# Patient Record
Sex: Female | Born: 2000 | Race: White | Hispanic: No | Marital: Single | Smoking: Never smoker
Health system: Southern US, Community
[De-identification: ages and names within clinical notes are randomized; demographics above are authoritative.]

## PROBLEM LIST (undated history)

## (undated) ENCOUNTER — Emergency Department (HOSPITAL_BASED_OUTPATIENT_CLINIC_OR_DEPARTMENT_OTHER): Admission: EM | Payer: Medicaid Other | Source: Home / Self Care

## (undated) DIAGNOSIS — S060XAA Concussion with loss of consciousness status unknown, initial encounter: Secondary | ICD-10-CM

## (undated) DIAGNOSIS — S0990XA Unspecified injury of head, initial encounter: Secondary | ICD-10-CM

## (undated) DIAGNOSIS — F329 Major depressive disorder, single episode, unspecified: Secondary | ICD-10-CM

## (undated) DIAGNOSIS — F32A Depression, unspecified: Secondary | ICD-10-CM

## (undated) DIAGNOSIS — F411 Generalized anxiety disorder: Secondary | ICD-10-CM

## (undated) DIAGNOSIS — G43909 Migraine, unspecified, not intractable, without status migrainosus: Secondary | ICD-10-CM

## (undated) DIAGNOSIS — F4310 Post-traumatic stress disorder, unspecified: Secondary | ICD-10-CM

## (undated) DIAGNOSIS — F419 Anxiety disorder, unspecified: Secondary | ICD-10-CM

## (undated) DIAGNOSIS — S060X9A Concussion with loss of consciousness of unspecified duration, initial encounter: Secondary | ICD-10-CM

## (undated) HISTORY — DX: Post-traumatic stress disorder, unspecified: F43.10

## (undated) HISTORY — DX: Major depressive disorder, single episode, unspecified: F32.9

## (undated) HISTORY — DX: Migraine, unspecified, not intractable, without status migrainosus: G43.909

## (undated) HISTORY — DX: Anxiety disorder, unspecified: F41.9

## (undated) HISTORY — PX: URETHRAL DILATION: SUR417

## (undated) HISTORY — DX: Concussion with loss of consciousness status unknown, initial encounter: S06.0XAA

## (undated) HISTORY — DX: Generalized anxiety disorder: F41.1

## (undated) HISTORY — DX: Concussion with loss of consciousness of unspecified duration, initial encounter: S06.0X9A

## (undated) HISTORY — DX: Unspecified injury of head, initial encounter: S09.90XA

## (undated) HISTORY — PX: TONSILLECTOMY AND ADENOIDECTOMY: SUR1326

## (undated) HISTORY — PX: TYMPANOSTOMY TUBE PLACEMENT: SHX32

## (undated) HISTORY — DX: Depression, unspecified: F32.A

---
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
## 2020-11-14 ENCOUNTER — Other Ambulatory Visit: Payer: Self-pay

## 2020-11-14 ENCOUNTER — Ambulatory Visit (INDEPENDENT_AMBULATORY_CARE_PROVIDER_SITE_OTHER): Payer: BC Managed Care – PPO | Admitting: Otolaryngology

## 2020-11-14 VITALS — Temp 97.5°F

## 2020-11-14 DIAGNOSIS — Z9622 Myringotomy tube(s) status: Secondary | ICD-10-CM | POA: Diagnosis not present

## 2020-11-14 NOTE — Progress Notes (Signed)
HPI: April Harding is a 20 y.o. female who presents for evaluation of retained left myringotomy tube.  Patient had a previous T&A performed here in 2004 and had tubes placed when she was a child.  However she last had a set of tubes placed in 2016 and has a retained tube in the left ear that gives her intermittent problems and drainage.  She has had no drainage or infections in the right ear recently.  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  Past Medical History:  Diagnosis Date  . Anxiety   . Depression    Past Surgical History:  Procedure Laterality Date  . TONSILLECTOMY AND ADENOIDECTOMY    . TYMPANOSTOMY TUBE PLACEMENT    . URETHRAL DILATION     Social History   Socioeconomic History  . Marital status: Single    Spouse name: Not on file  . Number of children: Not on file  . Years of education: Not on file  . Highest education level: Not on file  Occupational History  . Not on file  Tobacco Use  . Smoking status: Never Smoker  . Smokeless tobacco: Never Used  Substance and Sexual Activity  . Alcohol use: No  . Drug use: No  . Sexual activity: Never  Other Topics Concern  . Not on file  Social History Narrative  . Not on file   Social Determinants of Health   Financial Resource Strain: Not on file  Food Insecurity: Not on file  Transportation Needs: Not on file  Physical Activity: Not on file  Stress: Not on file  Social Connections: Not on file   Family History  Problem Relation Age of Onset  . Sudden death Neg Hx   . Hyperlipidemia Neg Hx   . Hypertension Neg Hx   . Heart attack Neg Hx   . Diabetes Neg Hx    Allergies  Allergen Reactions  . Caine-1 [Lidocaine] Swelling    Novacaine   Prior to Admission medications   Medication Sig Start Date End Date Taking? Authorizing Provider  ALBUTEROL IN Inhale 2 puffs into the lungs as needed.    [provider]  cefdinir (OMNICEF) 300 MG capsule Take 1 capsule (300 mg total) by mouth 2  (two) times daily. 08/26/14   Janne Napoleon, NP  cetirizine (ZYRTEC) 10 MG tablet Take 10 mg by mouth daily.    [provider]  EPINEPHrine (EPIPEN JR) 0.15 MG/0.3ML injection Inject 0.15 mg into the muscle as needed.    [provider]  FLUoxetine (PROZAC) 10 MG tablet Take 30 mg by mouth daily.    [provider]  fluticasone (FLONASE) 50 MCG/ACT nasal spray Place 2 sprays into the nose daily.    [provider]  HYDROcodone-acetaminophen (NORCO/VICODIN) 5-325 MG per tablet Take 0.5 tablets by mouth every 4 (four) hours as needed. 08/26/14   Janne Napoleon, NP  loperamide (IMODIUM) 2 MG capsule Take 1 capsule (2 mg total) by mouth 4 (four) times daily as needed for diarrhea or loose stools. 11/29/13   Molpus, John, MD  ondansetron (ZOFRAN) 4 MG tablet Take 1 tablet (4 mg total) by mouth every 6 (six) hours. 11/27/13   Irish Elders, FNP  polyethylene glycol (MIRALAX / GLYCOLAX) packet Take 17 g by mouth daily.    [provider]     Positive ROS: Otherwise negative  All other systems have been reviewed and were otherwise negative with the exception of those mentioned in  the HPI and as above.  Physical Exam: Constitutional: Alert, well-appearing, no acute distress Ears: External ears without lesions or tenderness. Ear canals are clear bilaterally.  Right TM is clear.  Left TM reveals a retained left myringotomy tube consistent with a Sheehy collar-button tube. Nasal: External nose without lesions. Septum midline. Clear nasal passages Oral: Lips and gums without lesions. Tongue and palate mucosa without lesions. Posterior oropharynx clear. Neck: No palpable adenopathy or masses Respiratory: Breathing comfortably  Skin: No facial/neck lesions or rash noted.  Procedures  Assessment: Retained left sheet collar-button myringotomy tube for 6 years.  Plan: I discussed removing this in the office today.  She would like to have it removed and we'll plan  on scheduling removal in a couple of days with topical anesthesia.  The tubes were previously placed under general anesthesia.  Narda Bonds, MD

## 2020-11-18 ENCOUNTER — Other Ambulatory Visit: Payer: Self-pay

## 2020-11-18 ENCOUNTER — Ambulatory Visit (INDEPENDENT_AMBULATORY_CARE_PROVIDER_SITE_OTHER): Payer: BC Managed Care – PPO | Admitting: Otolaryngology

## 2020-11-18 VITALS — Temp 96.6°F

## 2020-11-18 DIAGNOSIS — T162XXA Foreign body in left ear, initial encounter: Secondary | ICD-10-CM

## 2020-11-18 NOTE — Progress Notes (Signed)
HPI: April Harding is a 20 y.o. female who returns today for evaluation of retained left myringotomy tube that she has had for a number of years.  She has a Sheehy collar-button tube embedded within the left TM with no active drainage.  She presents today to have this removed.  Right TM is clear..  Past Medical History:  Diagnosis Date  . Anxiety   . Depression    Past Surgical History:  Procedure Laterality Date  . TONSILLECTOMY AND ADENOIDECTOMY    . TYMPANOSTOMY TUBE PLACEMENT    . URETHRAL DILATION     Social History   Socioeconomic History  . Marital status: Single    Spouse name: Not on file  . Number of children: Not on file  . Years of education: Not on file  . Highest education level: Not on file  Occupational History  . Not on file  Tobacco Use  . Smoking status: Never Smoker  . Smokeless tobacco: Never Used  Substance and Sexual Activity  . Alcohol use: No  . Drug use: No  . Sexual activity: Never  Other Topics Concern  . Not on file  Social History Narrative  . Not on file   Social Determinants of Health   Financial Resource Strain: Not on file  Food Insecurity: Not on file  Transportation Needs: Not on file  Physical Activity: Not on file  Stress: Not on file  Social Connections: Not on file   Family History  Problem Relation Age of Onset  . Sudden death Neg Hx   . Hyperlipidemia Neg Hx   . Hypertension Neg Hx   . Heart attack Neg Hx   . Diabetes Neg Hx    Allergies  Allergen Reactions  . Caine-1 [Lidocaine] Swelling    Novacaine   Prior to Admission medications   Medication Sig Start Date End Date Taking? Authorizing Provider  ALBUTEROL IN Inhale 2 puffs into the lungs as needed.    [provider]  cefdinir (OMNICEF) 300 MG capsule Take 1 capsule (300 mg total) by mouth 2 (two) times daily. 08/26/14   Janne Napoleon, NP  cetirizine (ZYRTEC) 10 MG tablet Take 10 mg by mouth daily.    [provider]  EPINEPHrine (EPIPEN JR)  0.15 MG/0.3ML injection Inject 0.15 mg into the muscle as needed.    [provider]  FLUoxetine (PROZAC) 10 MG tablet Take 30 mg by mouth daily.    [provider]  fluticasone (FLONASE) 50 MCG/ACT nasal spray Place 2 sprays into the nose daily.    [provider]  HYDROcodone-acetaminophen (NORCO/VICODIN) 5-325 MG per tablet Take 0.5 tablets by mouth every 4 (four) hours as needed. 08/26/14   Janne Napoleon, NP  loperamide (IMODIUM) 2 MG capsule Take 1 capsule (2 mg total) by mouth 4 (four) times daily as needed for diarrhea or loose stools. 11/29/13   Molpus, John, MD  ondansetron (ZOFRAN) 4 MG tablet Take 1 tablet (4 mg total) by mouth every 6 (six) hours. 11/27/13   Irish Elders, FNP  polyethylene glycol (MIRALAX / GLYCOLAX) packet Take 17 g by mouth daily.    [provider]     Positive ROS: Otherwise negative  All other systems have been reviewed and were otherwise negative with the exception of those mentioned in the HPI and as above.  Physical Exam: Constitutional: Alert, well-appearing, no acute distress Ears: External ears without lesions or tenderness.  Right TM is clear.  Left TM reveals a retained  Sheehy collar-button tube.  Xylocaine was placed in the ear for local topical anesthetic.  After 5 minutes the tube was removed from the TM with small perforation and granulation tissue.  I would expect this to heal spontaneously. Nasal: External nose without lesions. Clear nasal passages Oral: Lips and gums without lesions. Tongue and palate mucosa without lesions. Posterior oropharynx clear. Neck: No palpable adenopathy or masses Respiratory: Breathing comfortably  Skin: No facial/neck lesions or rash noted.  Removal of ear foreign body  Date/Time: 11/18/2020 10:39 AM Performed by: Drema Halon, MD Authorized by: Drema Halon, MD   Consent:    Consent obtained:  Verbal   Consent given by:  Patient Location:    Location:   Ear Pre-procedure details:    Imaging:  None Anesthesia:    Topical anesthetic:  Lidocaine drops Procedure details:    Localization method:  Microscope   Removal mechanism:  Alligator forceps   Procedure complexity:  Simple   Foreign bodies recovered:  3 Comments:     Patient with retained left sheehy collar-button tube that was removed using a microscope and alligator forceps.  This was removed easily with minimal TM perforation and granulation tissue that should heal spontaneously.    Assessment: Retained left myringotomy tube.  Plan: This was removed in the office discussed with patient concerning keeping water out of the ear for the next 2 weeks and will notify us if she has any further problems with the ear.   Narda Bonds, MD

## 2020-12-18 ENCOUNTER — Encounter (HOSPITAL_BASED_OUTPATIENT_CLINIC_OR_DEPARTMENT_OTHER): Payer: Self-pay | Admitting: Emergency Medicine

## 2020-12-18 ENCOUNTER — Emergency Department (HOSPITAL_BASED_OUTPATIENT_CLINIC_OR_DEPARTMENT_OTHER)
Admission: EM | Admit: 2020-12-18 | Discharge: 2020-12-18 | Disposition: A | Discharge status: Home / Self Care | Payer: BC Managed Care – PPO | Attending: Emergency Medicine | Admitting: Emergency Medicine

## 2020-12-18 ENCOUNTER — Emergency Department (HOSPITAL_BASED_OUTPATIENT_CLINIC_OR_DEPARTMENT_OTHER): Payer: BC Managed Care – PPO

## 2020-12-18 ENCOUNTER — Other Ambulatory Visit: Payer: Self-pay

## 2020-12-18 DIAGNOSIS — S0990XA Unspecified injury of head, initial encounter: Secondary | ICD-10-CM | POA: Diagnosis present

## 2020-12-18 DIAGNOSIS — S060X1A Concussion with loss of consciousness of 30 minutes or less, initial encounter: Secondary | ICD-10-CM | POA: Insufficient documentation

## 2020-12-18 DIAGNOSIS — W2107XA Struck by softball, initial encounter: Secondary | ICD-10-CM | POA: Diagnosis not present

## 2020-12-18 MED ORDER — ONDANSETRON HCL 4 MG PO TABS: 4.0000 mg | ORAL_TABLET | Freq: Four times a day (QID) | ORAL | 0 refills | Status: DC

## 2020-12-18 NOTE — ED Triage Notes (Signed)
Reports being hit in the head with a softball going using pitching machine.  Hit on the front of the head with helmet on.  Reports she blacked out for a second.  This happened around 6pm.  Having headache since.

## 2020-12-18 NOTE — ED Provider Notes (Signed)
[facility] EMERGENCY DEPARTMENT Provider Note   CSN: [id number] Arrival date & time: 12/18/20  2035     History Chief Complaint  Patient presents with  . Head Injury    April Harding is a 20 y.o. female.  Pt presents to the ED today with a headache.  Pt was using a softball pitching machine and was hit in the front of her head with a softball going about 50 mph.  The pt was wearing a catcher's helmet.  She thinks she blacked out for a second.  She's had a headache since and has had some nausea, although no nausea now.  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        Past Medical History:  Diagnosis Date  . Anxiety   . Depression     Patient Active Problem List   Diagnosis Date Noted  . Concussion with loss of consciousness 02/20/2013  . Anxiety, generalized 03/26/2012  . Family disruption due to divorce 03/26/2012    Past Surgical History:  Procedure Laterality Date  . TONSILLECTOMY AND ADENOIDECTOMY    . TYMPANOSTOMY TUBE PLACEMENT    . URETHRAL DILATION       OB History   No obstetric history on file.     Family History  Problem Relation Age of Onset  . Sudden death Neg Hx   . Hyperlipidemia Neg Hx   . Hypertension Neg Hx   . Heart attack Neg Hx   . Diabetes Neg Hx     Social History   Tobacco Use  . Smoking status: Never Smoker  . Smokeless tobacco: Never Used  Substance Use Topics  . Alcohol use: No  . Drug use: No    Home Medications Prior to Admission medications   Medication Sig Start Date End Date Taking? Authorizing Provider  DULoxetine (CYMBALTA) 20 MG capsule Take 20 mg by mouth daily.   Yes [provider]  ondansetron (ZOFRAN) 4 MG tablet Take 1 tablet (4 mg total) by mouth every 6 (six) hours. 12/18/20  Yes Jacalyn Lefevre, MD  ALBUTEROL IN Inhale 2 puffs into the lungs as needed.    [provider]  cefdinir (OMNICEF) 300 MG capsule Take 1 capsule (300 mg total) by mouth 2 (two) times daily. 08/26/14    Janne Napoleon, NP  cetirizine (ZYRTEC) 10 MG tablet Take 10 mg by mouth daily.    [provider]  EPINEPHrine (EPIPEN JR) 0.15 MG/0.3ML injection Inject 0.15 mg into the muscle as needed.    [provider]  FLUoxetine (PROZAC) 10 MG tablet Take 30 mg by mouth daily.    [provider]  fluticasone (FLONASE) 50 MCG/ACT nasal spray Place 2 sprays into the nose daily.    [provider]  HYDROcodone-acetaminophen (NORCO/VICODIN) 5-325 MG per tablet Take 0.5 tablets by mouth every 4 (four) hours as needed. 08/26/14   Janne Napoleon, NP  loperamide (IMODIUM) 2 MG capsule Take 1 capsule (2 mg total) by mouth 4 (four) times daily as needed for diarrhea or loose stools. 11/29/13   Molpus, John, MD  polyethylene glycol (MIRALAX / GLYCOLAX) packet Take 17 g by mouth daily.    [provider]    Allergies    Caine-1 [lidocaine]  Review of Systems   Review of Systems  Gastrointestinal: Positive for nausea.  Neurological: Positive for headaches.    Physical Exam Updated Vital Signs BP 130/77 (BP Location: Right Arm)   Pulse 90   Temp 98.6  F (37 C) (Oral)   Resp 18   Ht 5\' 8"  (1.727 m)   Wt 71.7 kg   SpO2 100%   BMI 24.02 kg/m   Physical Exam Vitals and nursing note reviewed.  Constitutional:      Appearance: Normal appearance.  HENT:     Head: Normocephalic and atraumatic.     Right Ear: External ear normal.     Left Ear: External ear normal.     Nose: Nose normal.     Mouth/Throat:     Mouth: Mucous membranes are moist.     Pharynx: Oropharynx is clear.  Eyes:     Extraocular Movements: Extraocular movements intact.     Conjunctiva/sclera: Conjunctivae normal.     Pupils: Pupils are equal, round, and reactive to light.  Cardiovascular:     Rate and Rhythm: Normal rate and regular rhythm.     Pulses: Normal pulses.     Heart sounds: Normal heart sounds.  Pulmonary:     Effort: Pulmonary effort is normal.     Breath sounds: Normal  breath sounds.  Abdominal:     General: Abdomen is flat. Bowel sounds are normal.     Palpations: Abdomen is soft.  Musculoskeletal:        General: Normal range of motion.     Cervical back: Normal range of motion and neck supple.  Skin:    General: Skin is warm.     Capillary Refill: Capillary refill takes less than 2 seconds.  Neurological:     General: No focal deficit present.     Mental Status: She is alert and oriented to person, place, and time.  Psychiatric:        Mood and Affect: Mood normal.        Behavior: Behavior normal.        Thought Content: Thought content normal.        Judgment: Judgment normal.     ED Results / Procedures / Treatments   Labs (all labs ordered are listed, but only abnormal results are displayed) Labs Reviewed - No data to display  EKG None  Radiology CT Head Wo Contrast  Result Date: 12/18/2020 CLINICAL DATA:  Status post hit in head by softball. EXAM: CT HEAD WITHOUT CONTRAST TECHNIQUE: Contiguous axial images were obtained from the base of the skull through the vertex without intravenous contrast. COMPARISON:  CT head 04/09/2013 FINDINGS: Brain: No evidence of large-territorial acute infarction. No parenchymal hemorrhage. No mass lesion. No extra-axial collection. No mass effect or midline shift. No hydrocephalus. Basilar cisterns are patent. Vascular: No hyperdense vessel. Skull: No acute fracture or focal lesion. Sinuses/Orbits: Paranasal sinuses and mastoid air cells are clear. The orbits are unremarkable. Other: None. IMPRESSION: No acute intracranial abnormality. Electronically Signed   By: 04/11/2013 M.D.   On: 12/18/2020 21:17    Procedures Procedures   Medications Ordered in ED Medications - No data to display  ED Course  I have reviewed the triage vital signs and the nursing notes.  Pertinent labs & imaging results that were available during my care of the patient were reviewed by me and considered in my medical  decision making (see chart for details).    MDM Rules/Calculators/A&P                          Ct ok, but pt likely has a mild concussion.  Pt told to talk to her athletic trainer about going  back to sport after concussion.   Final Clinical Impression(s) / ED Diagnoses Final diagnoses:  Concussion with loss of consciousness of 30 minutes or less, initial encounter  Minor head injury, initial encounter    Rx / DC Orders ED Discharge Orders         Ordered    ondansetron (ZOFRAN) 4 MG tablet  Every 6 hours        12/18/20 2130           Jacalyn Lefevre, MD 12/18/20 2130

## 2021-01-15 ENCOUNTER — Emergency Department (HOSPITAL_BASED_OUTPATIENT_CLINIC_OR_DEPARTMENT_OTHER)
Admission: EM | Admit: 2021-01-15 | Discharge: 2021-01-15 | Disposition: A | Discharge status: Home / Self Care | Payer: BC Managed Care – PPO | Attending: Emergency Medicine | Admitting: Emergency Medicine

## 2021-01-15 ENCOUNTER — Other Ambulatory Visit: Payer: Self-pay

## 2021-01-15 ENCOUNTER — Encounter (HOSPITAL_BASED_OUTPATIENT_CLINIC_OR_DEPARTMENT_OTHER): Payer: Self-pay | Admitting: Emergency Medicine

## 2021-01-15 DIAGNOSIS — R630 Anorexia: Secondary | ICD-10-CM | POA: Insufficient documentation

## 2021-01-15 DIAGNOSIS — R5383 Other fatigue: Secondary | ICD-10-CM | POA: Insufficient documentation

## 2021-01-15 DIAGNOSIS — R002 Palpitations: Secondary | ICD-10-CM | POA: Diagnosis not present

## 2021-01-15 DIAGNOSIS — Z79899 Other long term (current) drug therapy: Secondary | ICD-10-CM | POA: Insufficient documentation

## 2021-01-15 DIAGNOSIS — X58XXXA Exposure to other specified factors, initial encounter: Secondary | ICD-10-CM | POA: Diagnosis not present

## 2021-01-15 DIAGNOSIS — T50905A Adverse effect of unspecified drugs, medicaments and biological substances, initial encounter: Secondary | ICD-10-CM

## 2021-01-15 DIAGNOSIS — R519 Headache, unspecified: Secondary | ICD-10-CM | POA: Diagnosis present

## 2021-01-15 DIAGNOSIS — G43809 Other migraine, not intractable, without status migrainosus: Secondary | ICD-10-CM | POA: Diagnosis not present

## 2021-01-15 DIAGNOSIS — T450X5A Adverse effect of antiallergic and antiemetic drugs, initial encounter: Secondary | ICD-10-CM | POA: Insufficient documentation

## 2021-01-15 LAB — CBC WITH DIFFERENTIAL/PLATELET
Abs Immature Granulocytes: 0.01 10*3/uL (ref 0.00–0.07)
Basophils Absolute: 0 10*3/uL (ref 0.0–0.1)
Basophils Relative: 1 %
Eosinophils Absolute: 0.2 10*3/uL (ref 0.0–0.5)
Eosinophils Relative: 2 %
HCT: 35.8 % — ABNORMAL LOW (ref 36.0–46.0)
Hemoglobin: 12.3 g/dL (ref 12.0–15.0)
Immature Granulocytes: 0 %
Lymphocytes Relative: 43 %
Lymphs Abs: 2.9 10*3/uL (ref 0.7–4.0)
MCH: 28.9 pg (ref 26.0–34.0)
MCHC: 34.4 g/dL (ref 30.0–36.0)
MCV: 84.2 fL (ref 80.0–100.0)
Monocytes Absolute: 0.6 10*3/uL (ref 0.1–1.0)
Monocytes Relative: 9 %
Neutro Abs: 3.1 10*3/uL (ref 1.7–7.7)
Neutrophils Relative %: 45 %
Platelets: 239 10*3/uL (ref 150–400)
RBC: 4.25 MIL/uL (ref 3.87–5.11)
RDW: 12.7 % (ref 11.5–15.5)
WBC: 6.8 10*3/uL (ref 4.0–10.5)
nRBC: 0 % (ref 0.0–0.2)

## 2021-01-15 LAB — BASIC METABOLIC PANEL
Anion gap: 8 (ref 5–15)
BUN: 7 mg/dL (ref 6–20)
CO2: 24 mmol/L (ref 22–32)
Calcium: 8.7 mg/dL — ABNORMAL LOW (ref 8.9–10.3)
Chloride: 105 mmol/L (ref 98–111)
Creatinine, Ser: 0.67 mg/dL (ref 0.44–1.00)
GFR, Estimated: >60 mL/min (ref >60)
Glucose, Bld: 96 mg/dL (ref 70–99)
Potassium: 3.6 mmol/L (ref 3.5–5.1)
Sodium: 137 mmol/L (ref 135–145)

## 2021-01-15 MED ORDER — SODIUM CHLORIDE 0.9 % IV BOLUS
1000.0000 mL | Freq: Once | INTRAVENOUS | Status: AC
  Administered 2021-01-15: 1000 mL via INTRAVENOUS

## 2021-01-15 MED ORDER — METOCLOPRAMIDE HCL 5 MG/ML IJ SOLN
10.0000 mg | Freq: Once | INTRAMUSCULAR | Status: AC
  Administered 2021-01-15: 10 mg via INTRAVENOUS
  Filled 2021-01-15: qty 2

## 2021-01-15 MED ORDER — LORAZEPAM 2 MG/ML IJ SOLN
1.0000 mg | Freq: Once | INTRAMUSCULAR | Status: AC
  Administered 2021-01-15: 1 mg via INTRAVENOUS
  Filled 2021-01-15: qty 1

## 2021-01-15 MED ORDER — KETOROLAC TROMETHAMINE 15 MG/ML IJ SOLN
15.0000 mg | Freq: Once | INTRAMUSCULAR | Status: AC
  Administered 2021-01-15: 15 mg via INTRAVENOUS
  Filled 2021-01-15: qty 1

## 2021-01-15 MED ORDER — DIPHENHYDRAMINE HCL 50 MG/ML IJ SOLN
25.0000 mg | Freq: Once | INTRAMUSCULAR | Status: AC
  Administered 2021-01-15: 25 mg via INTRAVENOUS
  Filled 2021-01-15: qty 1

## 2021-01-15 NOTE — ED Triage Notes (Addendum)
Reports HA with photosensitivity, intermittent tachycardia, intermittent lightheadedness, low energy, posterior neck pain, and lower back pain X 5 days. Denies fever, n/v/d but does endorse abd pain after eating.

## 2021-01-15 NOTE — ED Notes (Signed)
Patient is resting comfortably. 

## 2021-01-15 NOTE — ED Provider Notes (Signed)
[facility] Provider Note: April Dell, MD, FACEP  CSN: [id number] MRN: [id number] ARRIVAL: 01/15/21 at 0342 ROOM: MH09/MH09   CHIEF COMPLAINT  Headache   HISTORY OF PRESENT ILLNESS  01/15/21 4:27 AM April Harding is a 20 y.o. female who has had a headache for the past 5 days.  The headache is located primarily a separately and bilateral parietally.  She describes it as aching and rates it as a 7 out of 10.  She has taken Excedrin without relief.  She has not had nausea or vomiting with it but does have photophobia.  She has had decreased appetite and general fatigue for the past 3 days.  She is also had intermittent palpitations (skipped beats, rapid heartbeat).  She has not had a fever or dysuria.  Her last menstrual period was 01/03/2021.   Past Medical History:  Diagnosis Date  . Anxiety   . Depression     Past Surgical History:  Procedure Laterality Date  . TONSILLECTOMY AND ADENOIDECTOMY    . TYMPANOSTOMY TUBE PLACEMENT    . URETHRAL DILATION      Family History  Problem Relation Age of Onset  . Sudden death Neg Hx   . Hyperlipidemia Neg Hx   . Hypertension Neg Hx   . Heart attack Neg Hx   . Diabetes Neg Hx     Social History   Tobacco Use  . Smoking status: Never Smoker  . Smokeless tobacco: Never Used  Substance Use Topics  . Alcohol use: No  . Drug use: No    Prior to Admission medications   Medication Sig Start Date End Date Taking? Authorizing Provider  ALBUTEROL IN Inhale 2 puffs into the lungs as needed.    [provider]  cetirizine (ZYRTEC) 10 MG tablet Take 10 mg by mouth daily.    [provider]  DULoxetine (CYMBALTA) 20 MG capsule Take 20 mg by mouth daily.    [provider]  EPINEPHrine (EPIPEN JR) 0.15 MG/0.3ML injection Inject 0.15 mg into the muscle as needed.    [provider]  FLUoxetine (PROZAC) 10 MG tablet Take 30 mg by mouth daily.    [provider]  fluticasone  (FLONASE) 50 MCG/ACT nasal spray Place 2 sprays into the nose daily.    [provider]  polyethylene glycol (MIRALAX / GLYCOLAX) packet Take 17 g by mouth daily.    [provider]    Allergies Reglan [metoclopramide] and Caine-1 [lidocaine]   REVIEW OF SYSTEMS  Negative except as noted here or in the History of Present Illness.   PHYSICAL EXAMINATION  Initial Vital Signs Blood pressure 123/85, pulse 89, temperature 98.5 F (36.9 C), temperature source Oral, resp. rate 20, height 5\' 8"  (1.727 m), weight 74.8 kg, last menstrual period 01/03/2021, SpO2 100 %.  Examination General: Well-developed, well-nourished female in no acute distress; appearance consistent with age of record HENT: normocephalic; atraumatic Eyes: pupils equal, round and reactive to light; extraocular muscles intact Neck: supple Heart: regular rate and rhythm Lungs: clear to auscultation bilaterally Abdomen: soft; nondistended; nontender; bowel sounds present Extremities: No deformity; full range of motion; pulses normal Neurologic: Awake, alert and oriented; motor function intact in all extremities and symmetric; no facial droop Skin: Warm and dry Psychiatric: Flat affect   RESULTS  Summary of this visit's results, reviewed and interpreted by myself:   EKG Interpretation  Date/Time:    Ventricular Rate:    PR Interval:    QRS Duration:  QT Interval:    QTC Calculation:   R Axis:     Text Interpretation:        Laboratory Studies: Results for orders placed or performed during the hospital encounter of 01/15/21 (from the past 24 hour(s))  CBC with Differential/Platelet     Status: Abnormal   Collection Time: 01/15/21  4:52 AM  Result Value Ref Range   WBC 6.8 4.0 - 10.5 K/uL   RBC 4.25 3.87 - 5.11 MIL/uL   Hemoglobin 12.3 12.0 - 15.0 g/dL   HCT 35.4 (L) 65.6 - 81.2 %   MCV 84.2 80.0 - 100.0 fL   MCH 28.9 26.0 - 34.0 pg   MCHC 34.4 30.0 - 36.0 g/dL   RDW 75.1 70.0 - 17.4 %    Platelets 239 150 - 400 K/uL   nRBC 0.0 0.0 - 0.2 %   Neutrophils Relative % 45 %   Neutro Abs 3.1 1.7 - 7.7 K/uL   Lymphocytes Relative 43 %   Lymphs Abs 2.9 0.7 - 4.0 K/uL   Monocytes Relative 9 %   Monocytes Absolute 0.6 0.1 - 1.0 K/uL   Eosinophils Relative 2 %   Eosinophils Absolute 0.2 0.0 - 0.5 K/uL   Basophils Relative 1 %   Basophils Absolute 0.0 0.0 - 0.1 K/uL   Immature Granulocytes 0 %   Abs Immature Granulocytes 0.01 0.00 - 0.07 K/uL  Basic metabolic panel     Status: Abnormal   Collection Time: 01/15/21  4:52 AM  Result Value Ref Range   Sodium 137 135 - 145 mmol/L   Potassium 3.6 3.5 - 5.1 mmol/L   Chloride 105 98 - 111 mmol/L   CO2 24 22 - 32 mmol/L   Glucose, Bld 96 70 - 99 mg/dL   BUN 7 6 - 20 mg/dL   Creatinine, Ser 9.44 0.44 - 1.00 mg/dL   Calcium 8.7 (L) 8.9 - 10.3 mg/dL   GFR, Estimated >96 >75 mL/min   Anion gap 8 5 - 15   Imaging Studies: No results found.  ED COURSE and MDM  Nursing notes, initial and subsequent vitals signs, including pulse oximetry, reviewed and interpreted by myself.  Vitals:   01/15/21 0430 01/15/21 0500 01/15/21 0515 01/15/21 0528  BP: 116/76     Pulse: 89 (!) 134 89 97  Resp:  (!) 28 (!) 25 19  Temp:      TempSrc:      SpO2: 100% 100% 98% 98%  Weight:      Height:       Medications  sodium chloride 0.9 % bolus 1,000 mL (1,000 mLs Intravenous New Bag/Given 01/15/21 0450)  diphenhydrAMINE (BENADRYL) injection 25 mg (25 mg Intravenous Given 01/15/21 0451)  metoCLOPramide (REGLAN) injection 10 mg (10 mg Intravenous Given 01/15/21 0451)  ketorolac (TORADOL) 15 MG/ML injection 15 mg (15 mg Intravenous Given 01/15/21 0451)  LORazepam (ATIVAN) injection 1 mg (1 mg Intravenous Given 01/15/21 0543)   6:06 AM The patient had a fairly severe episode of akathisia following Reglan administration.  Her heart rate went up to about 160 (sinus tachycardia).  She was threatening to pull her IV out and leave.  She was given 1 mg of Ativan  and calm down.  She is now resting peacefully.  She will awaken and states her headache is completely gone at this time.  I suspect new onset migraine given her age.  Her mother states she does have a history of anxiety and and the akathisia may have  triggered a panic attack.  PROCEDURES  Procedures   ED DIAGNOSES     ICD-10-CM   1. Migraine variant with headache  G43.809   2. Adverse effect of drug, initial encounter  T50.905A        Jahlil Ziller, Jonny Ruiz, MD 01/15/21 [phone]

## 2021-01-15 NOTE — ED Notes (Signed)
Pt mother asking RN to come to bedside after pt heart rate begins increasing rapidly and states she is having a panic attack. Rn and MD at bedside at this time to assess pt. Pt mother reports that Pt has hx of anxiety attacks. RN recently gave 10mg  of reglan to PT via IV push and MD states that her anxiety attack was caused by the dosage of reglan. PT allergy list updated at this time. RN at bedside until Pt became calm and then restful.

## 2021-02-07 ENCOUNTER — Encounter: Payer: Self-pay | Admitting: *Deleted

## 2021-02-08 ENCOUNTER — Ambulatory Visit: Payer: No Typology Code available for payment source | Admitting: Diagnostic Neuroimaging

## 2021-02-08 ENCOUNTER — Encounter: Payer: Self-pay | Admitting: Diagnostic Neuroimaging

## 2021-02-08 VITALS — BP 125/78 | HR 96 | Ht 68.0 in | Wt 168.0 lb

## 2021-02-08 DIAGNOSIS — F0781 Postconcussional syndrome: Secondary | ICD-10-CM | POA: Diagnosis not present

## 2021-02-08 DIAGNOSIS — R519 Headache, unspecified: Secondary | ICD-10-CM

## 2021-02-08 DIAGNOSIS — R51 Headache: Secondary | ICD-10-CM
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
## 2021-03-08 ENCOUNTER — Other Ambulatory Visit: Payer: Self-pay

## 2021-03-08 ENCOUNTER — Encounter (HOSPITAL_COMMUNITY): Payer: Self-pay | Admitting: Registered Nurse

## 2021-03-08 ENCOUNTER — Ambulatory Visit (INDEPENDENT_AMBULATORY_CARE_PROVIDER_SITE_OTHER)
Admission: EM | Admit: 2021-03-08 | Discharge: 2021-03-08 | Disposition: A | Discharge status: Other Acute Inpatient Hospital | Payer: No Typology Code available for payment source | Source: Home / Self Care

## 2021-03-08 DIAGNOSIS — Z79899 Other long term (current) drug therapy: Secondary | ICD-10-CM | POA: Insufficient documentation

## 2021-03-08 DIAGNOSIS — Z20822 Contact with and (suspected) exposure to covid-19: Secondary | ICD-10-CM | POA: Insufficient documentation

## 2021-03-08 DIAGNOSIS — F411 Generalized anxiety disorder: Secondary | ICD-10-CM | POA: Insufficient documentation

## 2021-03-08 DIAGNOSIS — F332 Major depressive disorder, recurrent severe without psychotic features: Secondary | ICD-10-CM | POA: Diagnosis present

## 2021-03-08 DIAGNOSIS — R45851 Suicidal ideations: Secondary | ICD-10-CM | POA: Insufficient documentation

## 2021-03-08 DIAGNOSIS — F410 Panic disorder [episodic paroxysmal anxiety] without agoraphobia: Secondary | ICD-10-CM | POA: Insufficient documentation

## 2021-03-08 LAB — CBC WITH DIFFERENTIAL/PLATELET
Abs Immature Granulocytes: 0.02 10*3/uL (ref 0.00–0.07)
Basophils Absolute: 0 10*3/uL (ref 0.0–0.1)
Basophils Relative: 1 %
Eosinophils Absolute: 0.1 10*3/uL (ref 0.0–0.5)
Eosinophils Relative: 2 %
HCT: 38.6 % (ref 36.0–46.0)
Hemoglobin: 13.2 g/dL (ref 12.0–15.0)
Immature Granulocytes: 0 %
Lymphocytes Relative: 34 %
Lymphs Abs: 1.9 10*3/uL (ref 0.7–4.0)
MCH: 29.7 pg (ref 26.0–34.0)
MCHC: 34.2 g/dL (ref 30.0–36.0)
MCV: 86.9 fL (ref 80.0–100.0)
Monocytes Absolute: 0.4 10*3/uL (ref 0.1–1.0)
Monocytes Relative: 8 %
Neutro Abs: 3.2 10*3/uL (ref 1.7–7.7)
Neutrophils Relative %: 55 %
Platelets: 285 10*3/uL (ref 150–400)
RBC: 4.44 MIL/uL (ref 3.87–5.11)
RDW: 12.5 % (ref 11.5–15.5)
WBC: 5.7 10*3/uL (ref 4.0–10.5)
nRBC: 0 % (ref 0.0–0.2)

## 2021-03-08 LAB — URINALYSIS, ROUTINE W REFLEX MICROSCOPIC
Bilirubin Urine: NEGATIVE
Glucose, UA: NEGATIVE mg/dL
Hgb urine dipstick: NEGATIVE
Ketones, ur: NEGATIVE mg/dL
Leukocytes,Ua: NEGATIVE
Nitrite: NEGATIVE
Protein, ur: NEGATIVE mg/dL
Specific Gravity, Urine: 1.004 — ABNORMAL LOW (ref 1.005–1.030)
pH: 8 (ref 5.0–8.0)

## 2021-03-08 LAB — LIPID PANEL
Cholesterol: 150 mg/dL (ref 0–200)
HDL: 42 mg/dL (ref >40)
LDL Cholesterol: 87 mg/dL (ref 0–99)
Total CHOL/HDL Ratio: 3.6 RATIO
Triglycerides: 105 mg/dL (ref <150)
VLDL: 21 mg/dL (ref 0–40)

## 2021-03-08 LAB — HEMOGLOBIN A1C
Hgb A1c MFr Bld: 5.2 % (ref 4.8–5.6)
Mean Plasma Glucose: 102.54 mg/dL

## 2021-03-08 LAB — COMPREHENSIVE METABOLIC PANEL
ALT: 19 U/L (ref 0–44)
AST: 23 U/L (ref 15–41)
Albumin: 4.3 g/dL (ref 3.5–5.0)
Alkaline Phosphatase: 64 U/L (ref 38–126)
Anion gap: 6 (ref 5–15)
BUN: 7 mg/dL (ref 6–20)
CO2: 25 mmol/L (ref 22–32)
Calcium: 9.2 mg/dL (ref 8.9–10.3)
Chloride: 105 mmol/L (ref 98–111)
Creatinine, Ser: 0.8 mg/dL (ref 0.44–1.00)
GFR, Estimated: >60 mL/min (ref >60)
Glucose, Bld: 95 mg/dL (ref 70–99)
Potassium: 4 mmol/L (ref 3.5–5.1)
Sodium: 136 mmol/L (ref 135–145)
Total Bilirubin: 0.2 mg/dL — ABNORMAL LOW (ref 0.3–1.2)
Total Protein: 6.8 g/dL (ref 6.5–8.1)

## 2021-03-08 LAB — TSH: TSH: 0.822 u[IU]/mL (ref 0.350–4.500)

## 2021-03-08 LAB — POCT URINE DRUG SCREEN - MANUAL ENTRY (I-SCREEN)
POC Amphetamine UR: NOT DETECTED
POC Buprenorphine (BUP): NOT DETECTED
POC Cocaine UR: NOT DETECTED
POC Marijuana UR: NOT DETECTED
POC Methadone UR: NOT DETECTED
POC Methamphetamine UR: NOT DETECTED
POC Morphine: NOT DETECTED
POC Oxazepam (BZO): POSITIVE — AB
POC Oxycodone UR: NOT DETECTED
POC Secobarbital (BAR): NOT DETECTED

## 2021-03-08 LAB — PREGNANCY, URINE: Preg Test, Ur: NEGATIVE

## 2021-03-08 LAB — MAGNESIUM: Magnesium: 1.9 mg/dL (ref 1.7–2.4)

## 2021-03-08 LAB — RESP PANEL BY RT-PCR (FLU A&B, COVID) ARPGX2
Influenza A by PCR: NEGATIVE
Influenza B by PCR: NEGATIVE
SARS Coronavirus 2 by RT PCR: NEGATIVE

## 2021-03-08 LAB — ETHANOL: Alcohol, Ethyl (B): <10 mg/dL (ref <10)

## 2021-03-08 LAB — POC SARS CORONAVIRUS 2 AG -  ED: SARS Coronavirus 2 Ag: NEGATIVE

## 2021-03-08 MED ORDER — ALPRAZOLAM 0.25 MG PO TABS
0.2500 mg | ORAL_TABLET | Freq: Every evening | ORAL | Status: DC | PRN
  Administered 2021-03-08: 0.25 mg via ORAL
  Filled 2021-03-08: qty 1

## 2021-03-08 MED ORDER — ALUM & MAG HYDROXIDE-SIMETH 200-200-20 MG/5ML PO SUSP: 30.0000 mL | ORAL | Status: DC | PRN

## 2021-03-08 MED ORDER — TRAZODONE HCL 50 MG PO TABS: 50.0000 mg | ORAL_TABLET | Freq: Every evening | ORAL | Status: DC | PRN

## 2021-03-08 MED ORDER — MAGNESIUM HYDROXIDE 400 MG/5ML PO SUSP: 30.0000 mL | Freq: Every day | ORAL | Status: DC | PRN

## 2021-03-08 MED ORDER — ACETAMINOPHEN 325 MG PO TABS: 650.0000 mg | ORAL_TABLET | Freq: Four times a day (QID) | ORAL | Status: DC | PRN

## 2021-03-08 MED ORDER — DULOXETINE HCL 60 MG PO CPEP
60.0000 mg | ORAL_CAPSULE | Freq: Every day | ORAL | Status: DC
  Administered 2021-03-08: 60 mg via ORAL
  Filled 2021-03-08: qty 1

## 2021-03-08 MED ORDER — ONDANSETRON 4 MG PO TBDP
4.0000 mg | ORAL_TABLET | Freq: Three times a day (TID) | ORAL | Status: DC | PRN
  Administered 2021-03-08: 4 mg via ORAL
  Filled 2021-03-08: qty 1

## 2021-03-08 NOTE — ED Notes (Signed)
Safe transport called 

## 2021-03-08 NOTE — ED Provider Notes (Signed)
Behavioral Health Admission H&P [facility] & OBS)  Date: 03/08/21 Patient Name: April Harding MRN: [id number] Chief Complaint: No chief complaint on file.  Chief Complaint/Presenting Problem: SI w/ plan to OD  Diagnoses:  Final diagnoses:  Suicidal ideation  GAD (generalized anxiety disorder)  MDD (major depressive disorder), recurrent severe, without psychosis ([name])  Panic disorder    HPI: April Harding, 20 y.o., female patient presents to [facility] as a walk in accompanied by her mother sent from her [facility] program with complaints of suicidal ideation and plan to overdose.  Patient seen face to face by this provider, consulted with Dr. Ernie Hew; and chart reviewed on 03/08/21.  On evaluation April Harding reports that she is having suicidal thoughts with a plan to overdose.  Reports stressors are "I don't have anything in my life.  I don't have a job, I had to leave school.  Theres just nothing in my life and I just feel like its my time to leave."  Patient reporting at her PHP today and while talking with therapist discussed her suicidal thoughts and was sent here and recommended for inpatient psychiatric treatment.  According to therapist at [facility]) patient reported have scary suicidal thoughts and had tried to reach out to her friends but everyone was busy.  States that patient had thought of taking pills but stopped last night because she was afraid it wouldn't work an she would be left with cognitive complications.  Patient has begun getting rid of her personal possessions.  Patient continues to endorse suicidal ideation and unable to contract for safety.  Patient denies prior suicide attempt or self harm.  No prior psychiatric admission.  Patient lives with her mother whom is supportive.  Patient reports history of panic disorder, general anxiety, and major depressive disorder.  Patient is currently taking Cymbalta 40 mg daily and Xanax 025 mg Q hs prn sleep.   Patient reports she has been tried on multiple psychotropic medications and had bad reactions.  Stating the Cymbalta for depression and  Xanax for panic, anxiety, and sleep are the only medications she hasn't had a bad reaction to.  Discussed trying Buspar for anxiety and panic disorder but patient states she is afraid to try anything different.  Discussed increasing Cymbalta to 60 mg and patient in agreement. States she has been taking the 40 mg for 40 and there has been little improvement in depression.  Patient gave permission to speak to her mother. During evaluation April Harding is sitting up right in chair with one foot in the seat.  Patient doesn't appear to be in acute distress.  She is alert/oriented x 4; calm/cooperative.  Her mood is depressed with flat affect.  She does not appear to be responding to internal/external stimuli or delusional thoughts.  Patient denies homicidal ideation, psychosis, and paranoia.  Patient answered question appropriately.  Collateral Information:  Spoke with patients mother who stats she is very concerned and that she has been watching her daughter because she is afraid she is going to do something to hurt herself.  States she asked her husband to remove the guns from the home "because I didn't want to come back home and find her if she had done something to herself."  Mother states that they have tried so many different medications "Our refrigerator looks like a pharmacy, but I'm removing all of those when I get home tonight."  Mother feels that patient is a risk to herself and needs inpatient psychiatric  treatment.     PHQ 2-9:   Flowsheet Row ED from 01/15/2021 in [facility] ED from 12/18/2020 in [place] No Risk No Risk       Total Time spent with patient: 45 minutes  Musculoskeletal  Strength & Muscle Tone: within normal limits Gait & Station: normal Patient leans:  N/A  Psychiatric Specialty Exam  Presentation General Appearance: Appropriate for Environment; Casual  Eye Contact:Good  Speech:Clear and Coherent; Normal Rate  Speech Volume:Normal  Handedness:Right   Mood and Affect  Mood:Depressed; Anxious  Affect:Flat; Depressed   Thought Process  Thought Processes:Coherent; Goal Directed  Descriptions of Associations:Intact  Orientation:Full (Time, Place and Person)  Thought Content:WDL  Diagnosis of Schizophrenia or Schizoaffective disorder in past: No   Hallucinations:Hallucinations: None  Ideas of Reference:None  Suicidal Thoughts:Suicidal Thoughts: Yes, Active SI Active Intent and/or Plan: With Intent; With Plan; With Means to Carry Out (Plan to overdose.  Patients' mother informs that guns have been removed from home)  Homicidal Thoughts:Homicidal Thoughts: No   Sensorium  Memory:No data recorded [place]  Concentration:Good  Attention Span:Good  [place]  Language:Good   Psychomotor Activity  Psychomotor Activity:Psychomotor Activity: Normal   Assets  Assets:Communication Skills; Desire for Improvement; Financial Resources/Insurance; Housing; Leisure Time; Resilience; Social Support; Transportation   Sleep  Sleep:Sleep: Good   Nutritional Assessment (For OBS and FBC admissions only) Has the patient had a weight loss or gain of 10 pounds or more in the last 3 months?: No Has the patient had a decrease in food intake/or appetite?: No Does the patient have dental problems?: No Does the patient have eating habits or behaviors that may be indicators of an eating disorder including binging or inducing vomiting?: No Has the patient recently lost weight without trying?: No Has the patient been eating poorly because of a decreased appetite?: No Malnutrition Screening Tool Score: 0    Physical Exam Vitals and nursing note reviewed.  Exam conducted with a chaperone present.  Constitutional:      General: She is not in acute distress.    Appearance: Normal appearance. She is not ill-appearing.  HENT:     Head: Normocephalic.  Cardiovascular:     Rate and Rhythm: Normal rate.  Pulmonary:     Effort: Pulmonary effort is normal.  Musculoskeletal:        General: Normal range of motion.     Cervical back: Normal range of motion.  Skin:    General: Skin is warm and dry.  Neurological:     General: No focal deficit present.     Mental Status: She is alert and oriented to person, place, and time.  Psychiatric:        Attention and Perception: Attention and perception normal. She does not perceive auditory or visual hallucinations.        Mood and Affect: Mood is anxious and depressed. Affect is flat.        Speech: Speech normal.        Behavior: Behavior normal. Behavior is cooperative.        Thought Content: Thought content is not paranoid or delusional. Thought content includes suicidal ideation. Thought content does not include homicidal ideation. Thought content includes suicidal plan.        Cognition and Memory: Cognition and memory normal.        Judgment: Judgment normal.    Review of  Systems  Constitutional: Negative.   HENT: Negative.   Eyes: Negative.   Respiratory: Negative.   Cardiovascular: Negative.   Gastrointestinal: Negative.   Genitourinary: Negative.   Musculoskeletal: Negative.   Skin: Negative.   Neurological: Negative for dizziness, seizures, loss of consciousness and headaches.       History of post concussion syndrome.  Patient reporting better, no longer having headaches.   Endo/Heme/Allergies: Negative.   Psychiatric/Behavioral: Positive for depression and suicidal ideas. Negative for hallucinations. The patient is nervous/anxious.     Blood pressure 121/79, pulse 84, temperature 99 F (37.2 C), temperature source Oral, resp. rate 18, SpO2 100 %. There is no height or weight on  file to calculate BMI.  Past Psychiatric History: GAD, panic disorder, MDD severe, recurrent without psychosis   Is the patient at risk to self? Yes  Has the patient been a risk to self in the past 6 months? No .    Has the patient been a risk to self within the distant past? No   Is the patient a risk to others? No   Has the patient been a risk to others in the past 6 months? No   Has the patient been a risk to others within the distant past? No   Past Medical History:  Past Medical History:  Diagnosis Date  . Anxiety   . Concussion    multiple  . Depression   . GAD (generalized anxiety disorder)   . Head injury    w/LOC  . MDD (major depressive disorder)   . Migraine   . PTSD (post-traumatic stress disorder)    d/t divorce in childhood    Past Surgical History:  Procedure Laterality Date  . TONSILLECTOMY AND ADENOIDECTOMY    . TYMPANOSTOMY TUBE PLACEMENT    . URETHRAL DILATION      Family History:  Family History  Problem Relation Age of Onset  . Atrial fibrillation Father   . Sudden death Neg Hx   . Hyperlipidemia Neg Hx   . Hypertension Neg Hx   . Heart attack Neg Hx   . Diabetes Neg Hx     Social History:  Social History   Socioeconomic History  . Marital status: Single    Spouse name: Not on file  . Number of children: 0  . Years of education: 67  . Highest education level: Not on file  Occupational History    Comment: [employer]  Tobacco Use  . Smoking status: Never Smoker  . Smokeless tobacco: Never Used  Substance and Sexual Activity  . Alcohol use: No  . Drug use: No  . Sexual activity: Never  Other Topics Concern  . Not on file  Social History Narrative   living with mom   Social Determinants of Health   Financial Resource Strain: Not on file  Food Insecurity: Not on file  Transportation Needs: Not on file  Physical Activity: Not on file  Stress: Not on file  Social Connections: Not on file  Intimate Partner Violence: Not  on file    SDOH:  SDOH Screenings   Alcohol Screen: Not on file  Depression (VEH2-0): Not on file  Financial Resource Strain: Not on file  Food Insecurity: Not on file  Housing: Not on file  Physical Activity: Not on file  Social Connections: Not on file  Stress: Not on file  Tobacco Use: Low Risk   . Smoking Tobacco Use: Never Smoker  . Smokeless Tobacco Use: Never Used  Transportation  Needs: Not on file    Last Labs:  Admission on 01/15/2021, Discharged on 01/15/2021  Component Date Value Ref Range Status  . WBC 01/15/2021 6.8  4.0 - 10.5 K/uL Final  . RBC 01/15/2021 4.25  3.87 - 5.11 MIL/uL Final  . Hemoglobin 01/15/2021 12.3  12.0 - 15.0 g/dL Final  . HCT 01/15/2021 35.8* 36.0 - 46.0 % Final  . MCV 01/15/2021 84.2  80.0 - 100.0 fL Final  . MCH 01/15/2021 28.9  26.0 - 34.0 pg Final  . MCHC 01/15/2021 34.4  30.0 - 36.0 g/dL Final  . RDW 01/15/2021 12.7  11.5 - 15.5 % Final  . Platelets 01/15/2021 239  150 - 400 K/uL Final  . nRBC 01/15/2021 0.0  0.0 - 0.2 % Final  . Neutrophils Relative % 01/15/2021 45  % Final  . Neutro Abs 01/15/2021 3.1  1.7 - 7.7 K/uL Final  . Lymphocytes Relative 01/15/2021 43  % Final  . Lymphs Abs 01/15/2021 2.9  0.7 - 4.0 K/uL Final  . Monocytes Relative 01/15/2021 9  % Final  . Monocytes Absolute 01/15/2021 0.6  0.1 - 1.0 K/uL Final  . Eosinophils Relative 01/15/2021 2  % Final  . Eosinophils Absolute 01/15/2021 0.2  0.0 - 0.5 K/uL Final  . Basophils Relative 01/15/2021 1  % Final  . Basophils Absolute 01/15/2021 0.0  0.0 - 0.1 K/uL Final  . Immature Granulocytes 01/15/2021 0  % Final  . Abs Immature Granulocytes 01/15/2021 0.01  0.00 - 0.07 K/uL Final   Performed at [facility], [address] [place], [place] [address]  . Sodium 01/15/2021 137  135 - 145 mmol/L Final  . Potassium 01/15/2021 3.6  3.5 - 5.1 mmol/L Final  . Chloride 01/15/2021 105  98 - 111 mmol/L Final  . CO2 01/15/2021 24  22 - 32 mmol/L Final  . Glucose, Bld  01/15/2021 96  70 - 99 mg/dL Final   Glucose reference range applies only to samples taken after fasting for at least 8 hours.  . BUN 01/15/2021 7  6 - 20 mg/dL Final  . Creatinine, Ser 01/15/2021 0.67  0.44 - 1.00 mg/dL Final  . Calcium 01/15/2021 8.7* 8.9 - 10.3 mg/dL Final  . GFR, Estimated 01/15/2021 >60  >60 mL/min Final   Comment: (NOTE) Calculated using the CKD-EPI Creatinine Equation (2021)   . Anion gap 01/15/2021 8  5 - 15 Final   Performed at [facility], [address] [place], [place] [address]    Allergies: Bee venom, Reglan [metoclopramide], and Caine-1 [lidocaine]  PTA Medications: (Not in a hospital admission)   Medical Decision Making  Patient admitted to Continuous Assessment Unit for safety and stabilization while waiting for appropriate bed for psychiatric hospitalization.  Patient informations sent to [place] for review.    Lab Orders     Resp Panel by RT-PCR (Flu A&B, Covid) Nasopharyngeal Swab     CBC with Differential/Platelet     Comprehensive metabolic panel     Hemoglobin A1c     Magnesium     Ethanol     Lipid panel     TSH     Urinalysis, Routine w reflex microscopic Urine, Clean Catch     Pregnancy, urine     POC SARS Coronavirus 2 Ag-ED - Nasal Swab (BD Veritor Kit)     POCT Urine Drug Screen - ([facility])  Medication Management: . DULoxetine  60 mg Oral Daily   Xanax  0.25 mg    Oral   Daily at bedtime PRN  Recommendations  Based on my evaluation the patient does not appear to have an emergency medical condition.  Daylen Hack, NP 03/08/21  4:41 PM

## 2021-03-08 NOTE — ED Notes (Signed)
Nurses report was given to [place] [email or web address]  adult

## 2021-03-08 NOTE — Progress Notes (Signed)
Received Early in the OBS area after the admission process. She was oriented to her new environment t and offered nourishments. She was given books to read.

## 2021-03-08 NOTE — ED Notes (Signed)
Pt does not have a locker.

## 2021-03-08 NOTE — ED Notes (Signed)
Patient complaining of nausea and not feeling "well" since "the Zyprexa kicked in." "I've taken Zyprexa before but it looked different.  Temp 98.4, 95, 16, 123/85 (97).  Patient sitting on bed.  Wanting to talk to her mom and expressed concern about transfer to [facility].  Reassured.  Patient stated feeling somewhat better.

## 2021-03-08 NOTE — ED Notes (Signed)
Pt c/o of symptom's  of nausea.

## 2021-03-08 NOTE — ED Notes (Signed)
Pt calm and cooperative. Alert and orient x 4. No pain or distress will continue to monitor for safety

## 2021-03-08 NOTE — Discharge Instructions (Addendum)
Transfer to [facility] 

## 2021-03-08 NOTE — BH Assessment (Signed)
New patient in lobby [place]  Patient endorses SI w plan to OD / denies HI/ AVH or substance use . Patient in [facility] program . Patient had a bad break up and feeling like she has no purpose . Patient is urgent

## 2021-03-08 NOTE — ED Notes (Signed)
Pt given 4 mg of zofran for nausea

## 2021-03-08 NOTE — ED Notes (Signed)
Patient received snack.  

## 2021-03-08 NOTE — ED Notes (Signed)
Pt given  word searches, crayons, and coloring sheets upon request

## 2021-03-08 NOTE — ED Provider Notes (Signed)
FBC/OBS ASAP Discharge Summary  Date and Time: 03/08/2021 8:33 PM  Name: April Harding  MRN:  [id number]   Discharge Diagnoses:  Final diagnoses:  Suicidal ideation  GAD (generalized anxiety disorder)  MDD (major depressive disorder), recurrent severe, without psychosis ([facility])  Panic disorder   Patient transferred to [facility] for inpatient psychiatric treatment.  April Harding, 20 y.o., female patient presents to [facility] as a walk in accompanied by her mother sent from her PHP program with complaints of suicidal ideation and plan to overdose.  Patient seen face to face by this provider, consulted with Dr. Earlene Plater; and chart reviewed on 03/08/21.  On evaluation April Harding reports that she is having suicidal thoughts with a plan to overdose.  Reports stressors are "I don't have anything in my life.  I don't have a job, I had to leave school.  Theres just nothing in my life and I just feel like its my time to leave."  Patient reporting at her PHP today and while talking with therapist discussed her suicidal thoughts and was sent here and recommended for inpatient psychiatric treatment.  According to therapist at [facility]) patient reported have scary suicidal thoughts and had tried to reach out to her friends but everyone was busy.  States that patient had thought of taking pills but stopped last night because she was afraid it wouldn't work an she would be left with cognitive complications.  Patient has begun getting rid of her personal possessions.  Patient continues to endorse suicidal ideation and unable to contract for safety.  Patient denies prior suicide attempt or self harm.  No prior psychiatric admission.  Patient lives with her mother whom is supportive.  Patient reports history of panic disorder, general anxiety, and major depressive disorder.  Patient is currently taking Cymbalta 40 mg daily and Xanax 025 mg Q hs prn sleep.  Patient reports she has been tried on  multiple psychotropic medications and had bad reactions.  Stating the Cymbalta for depression and  Xanax for panic, anxiety, and sleep are the only medications she hasn't had a bad reaction to.  Discussed trying Buspar for anxiety and panic disorder but patient states she is afraid to try anything different.  Discussed increasing Cymbalta to 60 mg and patient in agreement. States she has been taking the 40 mg for 40 and there has been little improvement in depression.  Patient gave permission to speak to her mother. During evaluation April Harding is sitting up right in chair with one foot in the seat.  Patient doesn't appear to be in acute distress.  She is alert/oriented x 4; calm/cooperative.  Her mood is depressed with flat affect.  She does not appear to be responding to internal/external stimuli or delusional thoughts.  Patient denies homicidal ideation, psychosis, and paranoia.  Patient answered question appropriately.  Collateral Information:  Spoke with patients mother who stats she is very concerned and that she has been watching her daughter because she is afraid she is going to do something to hurt herself.  States she asked her husband to remove the guns from the home "because I didn't want to come back home and find her if she had done something to herself."  Mother states that they have tried so many different medications "Our refrigerator looks like a pharmacy, but I'm removing all of those when I get home tonight."  Mother feels that patient is a risk to herself and needs inpatient psychiatric treatment.  Past Medical History:  Past Medical History:  Diagnosis Date  . Anxiety   . Concussion    multiple  . Depression   . GAD (generalized anxiety disorder)   . Head injury    w/LOC  . MDD (major depressive disorder)   . Migraine   . PTSD (post-traumatic stress disorder)    d/t divorce in childhood    Past Surgical History:  Procedure Laterality Date  . TONSILLECTOMY AND  ADENOIDECTOMY    . TYMPANOSTOMY TUBE PLACEMENT    . URETHRAL DILATION     Family History:  Family History  Problem Relation Age of Onset  . Atrial fibrillation Father   . Sudden death Neg Hx   . Hyperlipidemia Neg Hx   . Hypertension Neg Hx   . Heart attack Neg Hx   . Diabetes Neg Hx     Social History:  Social History   Substance and Sexual Activity  Alcohol Use No     Social History   Substance and Sexual Activity  Drug Use No    Social History   Socioeconomic History  . Marital status: Single    Spouse name: Not on file  . Number of children: 0  . Years of education: 40  . Highest education level: Not on file  Occupational History    Comment: [employer]  Tobacco Use  . Smoking status: Never Smoker  . Smokeless tobacco: Never Used  Substance and Sexual Activity  . Alcohol use: No  . Drug use: No  . Sexual activity: Never  Other Topics Concern  . Not on file  Social History Narrative   living with mom   Social Determinants of Health   Financial Resource Strain: Not on file  Food Insecurity: Not on file  Transportation Needs: Not on file  Physical Activity: Not on file  Stress: Not on file  Social Connections: Not on file   SDOH:  SDOH Screenings   Alcohol Screen: Not on file  Depression (WCB7-6): Not on file  Financial Resource Strain: Not on file  Food Insecurity: Not on file  Housing: Not on file  Physical Activity: Not on file  Social Connections: Not on file  Stress: Not on file  Tobacco Use: Low Risk   . Smoking Tobacco Use: Never Smoker  . Smokeless Tobacco Use: Never Used  Transportation Needs: Not on file    Has this patient used any form of tobacco in the last 30 days? (Cigarettes, Smokeless Tobacco, Cigars, and/or Pipes) Prescription not provided because: patient transferred to inpatient facility  Current Medications:  Current Facility-Administered Medications  Medication Dose Route Frequency Provider Last Rate Last  Admin  . acetaminophen (TYLENOL) tablet 650 mg  650 mg Oral Q6H PRN Rankin, Shuvon B, NP      . ALPRAZolam (XANAX) tablet 0.25 mg  0.25 mg Oral QHS PRN Rankin, Shuvon B, NP      . alum & mag hydroxide-simeth (MAALOX/MYLANTA) 200-200-20 MG/5ML suspension 30 mL  30 mL Oral Q4H PRN Rankin, Shuvon B, NP      . DULoxetine (CYMBALTA) DR capsule 60 mg  60 mg Oral Daily Rankin, Shuvon B, NP   60 mg at 03/08/21 1827  . magnesium hydroxide (MILK OF MAGNESIA) suspension 30 mL  30 mL Oral Daily PRN Rankin, Shuvon B, NP       Current Outpatient Medications  Medication Sig Dispense Refill  . DULoxetine (CYMBALTA) 20 MG capsule Take 40 mg by mouth daily.  PTA Medications: (Not in a hospital admission)   Musculoskeletal  Strength & Muscle Tone: within normal limits Gait & Station: normal Patient leans: N/A  Psychiatric Specialty Exam  Presentation  General Appearance: Appropriate for Environment; Casual  Eye Contact:Good  Speech:Clear and Coherent; Normal Rate  Speech Volume:Normal  Handedness:Right   Mood and Affect  Mood:Depressed; Anxious  Affect:Flat; Depressed   Thought Process  Thought Processes:Coherent; Goal Directed  Descriptions of Associations:Intact  Orientation:Full (Time, Place and Person)  Thought Content:WDL  Diagnosis of Schizophrenia or Schizoaffective disorder in past: No    Hallucinations:Hallucinations: None  Ideas of Reference:None  Suicidal Thoughts:Suicidal Thoughts: Yes, Active SI Active Intent and/or Plan: With Intent; With Plan; With Means to Carry Out (Plan to overdose.  Patients' mother informs that guns have been removed from home)  Homicidal Thoughts:Homicidal Thoughts: No   Sensorium  Memory:No data recorded Judgment:Fair  Insight:Fair   Executive Functions  Concentration:Good  Attention Span:Good  Recall:Good  Fund of Knowledge:Good  Language:Good   Psychomotor Activity  Psychomotor Activity:Psychomotor Activity:  Normal   Assets  Assets:Communication Skills; Desire for Improvement; Financial Resources/Insurance; Housing; Leisure Time; Resilience; Social Support; Transportation   Sleep  Sleep:Sleep: Good   Nutritional Assessment (For OBS and FBC admissions only) Has the patient had a weight loss or gain of 10 pounds or more in the last 3 months?: No Has the patient had a decrease in food intake/or appetite?: No Does the patient have dental problems?: No Does the patient have eating habits or behaviors that may be indicators of an eating disorder including binging or inducing vomiting?: No Has the patient recently lost weight without trying?: No Has the patient been eating poorly because of a decreased appetite?: No Malnutrition Screening Tool Score: 0    Physical Exam  Physical Exam Constitutional:      General: She is not in acute distress.    Appearance: She is not ill-appearing, toxic-appearing or diaphoretic.  Pulmonary:     Effort: Pulmonary effort is normal. No respiratory distress.  Musculoskeletal:        General: Normal range of motion.  Neurological:     Mental Status: She is alert and oriented to person, place, and time.  Psychiatric:        Mood and Affect: Mood is anxious and depressed.    Review of Systems  Respiratory: Negative for cough and shortness of breath.   Cardiovascular: Negative for chest pain.  Gastrointestinal: Positive for nausea. Negative for diarrhea and vomiting.   Blood pressure 121/79, pulse 84, temperature 99 F (37.2 C), temperature source Oral, resp. rate 18, SpO2 100 %. There is no height or weight on file to calculate BMI.  Disposition: Patient transferred to [facility] for inpatient psychiatric treatment  Jackelyn Poling, NP 03/08/2021, 8:33 PM
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
## 2021-03-09 ENCOUNTER — Inpatient Hospital Stay (HOSPITAL_COMMUNITY)
Admission: AD | Admit: 2021-03-09 | Discharge: 2021-03-11 | DRG: 885 | Disposition: A | Discharge status: Home / Self Care | Payer: No Typology Code available for payment source | Source: Intra-hospital | Attending: Psychiatry | Admitting: Psychiatry

## 2021-03-09 ENCOUNTER — Encounter (HOSPITAL_COMMUNITY): Payer: Self-pay | Admitting: Registered Nurse

## 2021-03-09 DIAGNOSIS — F4310 Post-traumatic stress disorder, unspecified: Secondary | ICD-10-CM | POA: Diagnosis present

## 2021-03-09 DIAGNOSIS — F411 Generalized anxiety disorder: Secondary | ICD-10-CM | POA: Diagnosis present

## 2021-03-09 DIAGNOSIS — F332 Major depressive disorder, recurrent severe without psychotic features: Principal | ICD-10-CM | POA: Diagnosis present

## 2021-03-09 DIAGNOSIS — F410 Panic disorder [episodic paroxysmal anxiety] without agoraphobia: Secondary | ICD-10-CM | POA: Diagnosis present

## 2021-03-09 DIAGNOSIS — R45851 Suicidal ideations: Secondary | ICD-10-CM | POA: Diagnosis present

## 2021-03-09 DIAGNOSIS — Z20822 Contact with and (suspected) exposure to covid-19: Secondary | ICD-10-CM | POA: Diagnosis present

## 2021-03-09 DIAGNOSIS — Z79899 Other long term (current) drug therapy: Secondary | ICD-10-CM | POA: Diagnosis not present

## 2021-03-09 DIAGNOSIS — Z8249 Family history of ischemic heart disease and other diseases of the circulatory system: Secondary | ICD-10-CM | POA: Diagnosis not present

## 2021-03-09 LAB — POCT PREGNANCY, URINE: Preg Test, Ur: NEGATIVE

## 2021-03-09 MED ORDER — MAGNESIUM HYDROXIDE 400 MG/5ML PO SUSP: 30.0000 mL | Freq: Every day | ORAL | Status: DC | PRN

## 2021-03-09 MED ORDER — MELATONIN 3 MG PO TABS
3.0000 mg | ORAL_TABLET | Freq: Every day | ORAL | Status: DC
  Administered 2021-03-09 – 2021-03-10 (×2): 3 mg via ORAL
  Filled 2021-03-09 (×5): qty 1

## 2021-03-09 MED ORDER — ALUM & MAG HYDROXIDE-SIMETH 200-200-20 MG/5ML PO SUSP: 30.0000 mL | ORAL | Status: DC | PRN

## 2021-03-09 MED ORDER — ACETAMINOPHEN 325 MG PO TABS: 650.0000 mg | ORAL_TABLET | Freq: Four times a day (QID) | ORAL | Status: DC | PRN

## 2021-03-09 MED ORDER — TRAZODONE HCL 50 MG PO TABS: 50.0000 mg | ORAL_TABLET | Freq: Every evening | ORAL | Status: DC | PRN

## 2021-03-09 MED ORDER — ONDANSETRON HCL 4 MG PO TABS
4.0000 mg | ORAL_TABLET | Freq: Four times a day (QID) | ORAL | Status: DC | PRN
  Filled 2021-03-09: qty 1

## 2021-03-09 MED ORDER — DULOXETINE HCL 60 MG PO CPEP
60.0000 mg | ORAL_CAPSULE | Freq: Every day | ORAL | Status: DC
  Administered 2021-03-09 – 2021-03-11 (×2): 60 mg via ORAL
  Filled 2021-03-09 (×5): qty 1

## 2021-03-09 MED ORDER — HYDROXYZINE HCL 25 MG PO TABS: 25.0000 mg | ORAL_TABLET | Freq: Three times a day (TID) | ORAL | Status: DC | PRN

## 2021-03-09 MED ORDER — ALPRAZOLAM 0.25 MG PO TABS
0.2500 mg | ORAL_TABLET | Freq: Every evening | ORAL | Status: DC | PRN
  Administered 2021-03-09 – 2021-03-10 (×3): 0.25 mg via ORAL
  Filled 2021-03-09 (×3): qty 1
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
## 2021-03-09 NOTE — Progress Notes (Signed)
   03/09/21 0902  Vital Signs  Temp 97.6 F (36.4 C)  Temp Source Oral  Pulse Rate (!) 114  Pulse Rate Source Monitor  BP (!) 115/102  BP Method Automatic  Oxygen Therapy  SpO2 99 %   D:Patient denied SI/HI/AVH. Patient admitted a little anxiety  and depression. Pt. Asked for a hot pack because it reminded her of her dog and would help calm her down. Pt. Reported that being "here" was not helpful and that she was not suicidal. Pt stated that she was just trying to clean out her place and it was not because she wanted to kill herself. Pt was on the phone and was crying.  A:  PT wanted her Cymbalta at night. Support and encouragement provided Routine safety checks conducted every 15 minutes. Patient  Informed to notify staff with any concerns.   R: Safety maintained.
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
## 2021-03-09 NOTE — Tx Team (Signed)
Initial Treatment Plan 03/09/2021 2:14 AM [name] [id number]    PATIENT STRESSORS: Loss of relationship with a Boyfriend   PATIENT STRENGTHS: Ability for insight Communication skills Motivation for treatment/growth Supportive family/friends   PATIENT IDENTIFIED PROBLEMS: Suicide thought  Anxiety  Depression  "I broke up with my boyfriend"               DISCHARGE CRITERIA:  Improved stabilization in mood, thinking, and/or behavior Need for constant or close observation no longer present Reduction of life-threatening or endangering symptoms to within safe limits  PRELIMINARY DISCHARGE PLAN: Outpatient therapy Return to previous living arrangement Return to previous work or school arrangements  PATIENT/FAMILY INVOLVEMENT: This treatment plan has been presented to and reviewed with the patient, April Harding, and/or family member.  The patient and family have been given the opportunity to ask questions and make suggestions.  Margarita Rana, RN 03/09/2021, 2:14 AM

## 2021-03-10 NOTE — Plan of Care (Signed)
Nurse discussed anxiety with patient.  

## 2021-03-10 NOTE — BHH Group Notes (Signed)
Adult Psychoeducational Group Note  Date:  03/10/2021 Time:  1:55 PM  Group Topic/Focus:  Orientation:   The focus of this group is to educate the patient on the purpose and policies of crisis stabilization and provide a format to answer questions about their admission.  The group details unit policies and expectations of patients while admitted.  Participation Level:  Active  Participation Quality:  Appropriate and Attentive  Affect:  Appropriate  Cognitive:  Alert and Appropriate  Insight: Appropriate and Good  Engagement in Group:  Engaged  Modes of Intervention:  Discussion  Additional Comments:  Pt attended morning goals group.  Deforest Hoyles Zariya Minner 03/10/2021, 1:55 PM

## 2021-03-10 NOTE — Progress Notes (Signed)
[facility] MD Progress Note  03/10/2021 4:27 PM April Harding  MRN:  [id number]   Reason for admission:  April Harding a 20 year old female with reported past psychiatric history of panic disorder, generalized anxiety disorder, depression and PTSD who presented as a walk-in accompanied by her mother at [facility] with a chief complaint of suicidal ideation with a plan to overdose on medications.   Objective: Medical record reviewed.  Patient's case discussed in detail with members of the treatment team.  I met with and evaluated the patient for follow-up on the unit today.  Patient denies depressed mood today.  She rates her anxiety as 3-4/10 in intensity.  Patient denies passive wish for death, SI, AI, HI, PI, AH or VH.  She reports that she slept well and her appetite is good.  Patient states that she talked to her mother and set up a plan for how to be safe at home.  She is looking forward to seeing her mother, her friends and playing with her dogs.  She denies medication side effects and denies any physical problems.  The patient presents with good eye contact and somewhat overly bright affect.  She is perseverative on wanting discharge.  She continues to minimize the statements about suicide and plans to overdose made to her therapist at [facility] and staff at [facility] prior to admission.  Patient reports that she talked to her mother and set up a plan for how to stay safe outside the hospital and that she is trying to use her "DBT skills."  The patient slept 6.75 hours last night.  Labs drawn last evening include lipid panel which was WNL, TSH which was WNL at 0.822, hemoglobin A1c which was WNL at 5.2 and urine pregnancy test which was negative.  Per staff report, the patient has been attending groups and participation has been appropriate.  The patient has been taking medications as prescribed.  Collateral information obtained from patient's mother  April Harding: I spoke with patient's mother April Harding [phone]) today with patient's permission and we spoke for approximately 30 minutes.  Patient signed consent authorizing team to communicate with mother.  April Harding states that she has spoken to the patient on the phone and that April Harding wants to be discharged home.  Ms. April Harding states that the medications in the house have been secured and there are no weapons in the house.  She states that the patient goes to [facility] partial hospital program and the plan is for her to return there after discharge and start back on Monday if discharged over the weekend otherwise start back on Tuesday.  Ms. April Harding states that she "cannot guarantee it" but she does not think that [place] would ever take an overdose but she did make statements about taking pills prior to admission.  April Harding states she believes therapist at [facility] misinterpreted some of patient's statements.  She believes patient was engaging only in spring cleaning and was not throwing away possessions in preparation to end her life.  She also admits that patient deleted some text messages from her father but that patient has been working on distancing herself from her father who has not been a positive presence in the patient's life.  We discussed patient's symptoms, treatment, plan for aftercare, etc.  Mother was provided opportunity to ask questions and verbalized concerns.  Mother's questions were answered.  I advised patient's mother that I am not certain patient will be discharged  over the weekend and that it may be Monday before she is discharged since I will not be the doctor on-call over the weekend.  I also discussed with mother that hospital social worker will be calling and will need to do safety planning with family member prior to patient's discharge.  Mother verbalized understanding of information discussed and expressed gratitude for the call.  Principal Problem:  Severe recurrent major depression without psychotic features ([place]) Diagnosis: Principal Problem:   Severe recurrent major depression without psychotic features ([place]) Active Problems:   GAD (generalized anxiety disorder)   Panic disorder  Total Time spent with patient: 25 minutes  Past Psychiatric History: The patient denies any history of inpatient psychiatric hospitalizations. She denies any history of suicide attempts.   The patient has been in the partial hospital program at [facility] since mid April 2022.  She denies any history of periods meeting criteria for mania or hypomania. She reports a history of nonsuicidal self-injurious behavior and last cut herself "to feel real" approximately 6 to 8 weeks ago. She reports a remote history of childhood trauma.  Past Medical History:  Past Medical History:  Diagnosis Date  . Anxiety   . Concussion    multiple  . Depression   . GAD (generalized anxiety disorder)   . Head injury    w/LOC  . MDD (major depressive disorder)   . Migraine   . PTSD (post-traumatic stress disorder)    d/t divorce in childhood    Past Surgical History:  Procedure Laterality Date  . TONSILLECTOMY AND ADENOIDECTOMY    . TYMPANOSTOMY TUBE PLACEMENT    . URETHRAL DILATION     Family History:  Family History  Problem Relation Age of Onset  . Atrial fibrillation Father   . Sudden death Neg Hx   . Hyperlipidemia Neg Hx   . Hypertension Neg Hx   . Heart attack Neg Hx   . Diabetes Neg Hx    Family Psychiatric  History:  The patient denies any known history of mental health problems in the family or family history of suicide. She reports that her father may have a problem with alcohol but she does not know.  Social History:  Social History   Substance and Sexual Activity  Alcohol Use No     Social History   Substance and Sexual Activity  Drug Use No    Social History   Socioeconomic History  . Marital status: Single    Spouse name: Not  on file  . Number of children: 0  . Years of education: 52  . Highest education level: Not on file  Occupational History    Comment: [employer]  Tobacco Use  . Smoking status: Never Smoker  . Smokeless tobacco: Never Used  Substance and Sexual Activity  . Alcohol use: No  . Drug use: No  . Sexual activity: Never  Other Topics Concern  . Not on file  Social History Narrative   living with mom   Social Determinants of Health   Financial Resource Strain: Not on file  Food Insecurity: Not on file  Transportation Needs: Not on file  Physical Activity: Not on file  Stress: Not on file  Social Connections: Not on file   Additional Social History:                         Sleep: Good  Appetite:  Good  Current Medications: Current Facility-Administered Medications  Medication Dose  Route Frequency Provider Last Rate Last Admin  . acetaminophen (TYLENOL) tablet 650 mg  650 mg Oral Q6H PRN Lindon Romp A, NP      . ALPRAZolam Duanne Moron) tablet 0.25 mg  0.25 mg Oral QHS PRN Lindon Romp A, NP   0.25 mg at 03/09/21 2142  . alum & mag hydroxide-simeth (MAALOX/MYLANTA) 200-200-20 MG/5ML suspension 30 mL  30 mL Oral Q4H PRN Rozetta Nunnery, NP      . DULoxetine (CYMBALTA) DR capsule 60 mg  60 mg Oral Daily Lindon Romp A, NP   60 mg at 03/09/21 1903  . hydrOXYzine (ATARAX/VISTARIL) tablet 25 mg  25 mg Oral TID PRN Arthor Captain, MD      . magnesium hydroxide (MILK OF MAGNESIA) suspension 30 mL  30 mL Oral Daily PRN Lindon Romp A, NP      . melatonin tablet 3 mg  3 mg Oral QHS Arthor Captain, MD   3 mg at 03/09/21 2142  . ondansetron (ZOFRAN) tablet 4 mg  4 mg Oral Q6H PRN Arthor Captain, MD      . traZODone (DESYREL) tablet 50 mg  50 mg Oral QHS PRN Arthor Captain, MD        Lab Results:  Results for orders placed or performed during the hospital encounter of 03/08/21 (from the past 48 hour(s))  CBC with Differential/Platelet     Status: None   Collection Time:  03/08/21  4:33 PM  Result Value Ref Range   WBC 5.7 4.0 - 10.5 K/uL   RBC 4.44 3.87 - 5.11 MIL/uL   Hemoglobin 13.2 12.0 - 15.0 g/dL   HCT 38.6 36.0 - 46.0 %   MCV 86.9 80.0 - 100.0 fL   MCH 29.7 26.0 - 34.0 pg   MCHC 34.2 30.0 - 36.0 g/dL   RDW 12.5 11.5 - 15.5 %   Platelets 285 150 - 400 K/uL   nRBC 0.0 0.0 - 0.2 %   Neutrophils Relative % 55 %   Neutro Abs 3.2 1.7 - 7.7 K/uL   Lymphocytes Relative 34 %   Lymphs Abs 1.9 0.7 - 4.0 K/uL   Monocytes Relative 8 %   Monocytes Absolute 0.4 0.1 - 1.0 K/uL   Eosinophils Relative 2 %   Eosinophils Absolute 0.1 0.0 - 0.5 K/uL   Basophils Relative 1 %   Basophils Absolute 0.0 0.0 - 0.1 K/uL   Immature Granulocytes 0 %   Abs Immature Granulocytes 0.02 0.00 - 0.07 K/uL    Comment: Performed at [facility], [address], [place], [place] [address]  Comprehensive metabolic panel     Status: Abnormal   Collection Time: 03/08/21  4:33 PM  Result Value Ref Range   Sodium 136 135 - 145 mmol/L   Potassium 4.0 3.5 - 5.1 mmol/L   Chloride 105 98 - 111 mmol/L   CO2 25 22 - 32 mmol/L   Glucose, Bld 95 70 - 99 mg/dL    Comment: Glucose reference range applies only to samples taken after fasting for at least 8 hours.   BUN 7 6 - 20 mg/dL   Creatinine, Ser 0.80 0.44 - 1.00 mg/dL   Calcium 9.2 8.9 - 10.3 mg/dL   Total Protein 6.8 6.5 - 8.1 g/dL   Albumin 4.3 3.5 - 5.0 g/dL   AST 23 15 - 41 U/L   ALT 19 0 - 44 U/L   Alkaline Phosphatase 64 38 - 126 U/L   Total  Bilirubin 0.2 (L) 0.3 - 1.2 mg/dL   GFR, Estimated >60 >60 mL/min    Comment: (NOTE) Calculated using the CKD-EPI Creatinine Equation (2021)    Anion gap 6 5 - 15    Comment: Performed at [facility] [address], [place], [place] [address]  Magnesium     Status: None   Collection Time: 03/08/21  4:33 PM  Result Value Ref Range   Magnesium 1.9 1.7 - 2.4 mg/dL    Comment: Performed at [facility], [place] [address] [place], [place] [address]  Resp Panel by  RT-PCR (Flu A&B, Covid) Nasopharyngeal Swab     Status: None   Collection Time: 03/08/21  4:38 PM   Specimen: Nasopharyngeal Swab; Nasopharyngeal(NP) swabs in vial transport medium  Result Value Ref Range   SARS Coronavirus 2 by RT PCR NEGATIVE NEGATIVE    Comment: (NOTE) SARS-CoV-2 target nucleic acids are NOT DETECTED.  The SARS-CoV-2 RNA is generally detectable in upper respiratory specimens during the acute phase of infection. The lowest concentration of SARS-CoV-2 viral copies this assay can detect is 138 copies/mL. A negative result does not preclude SARS-Cov-2 infection and should not be used as the sole basis for treatment or other patient management decisions. A negative result may occur with  improper specimen collection/handling, submission of specimen other than nasopharyngeal swab, presence of viral mutation(s) within the areas targeted by this assay, and inadequate number of viral copies(<138 copies/mL). A negative result must be combined with clinical observations, patient history, and epidemiological information. The expected result is Negative.  Fact Sheet for Patients:  EntrepreneurPulse.com.au  Fact Sheet for Healthcare Providers:  IncredibleEmployment.be  This test is no t yet approved or cleared by the [place] FDA and  has been authorized for detection and/or diagnosis of SARS-CoV-2 by FDA under an Emergency Use Authorization (EUA). This EUA will remain  in effect (meaning this test can be used) for the duration of the COVID-19 declaration under Section 564(b)(1) of the Act, 21 U.S.C.section 360bbb-3(b)(1), unless the authorization is terminated  or revoked sooner.       Influenza A by PCR NEGATIVE NEGATIVE   Influenza B by PCR NEGATIVE NEGATIVE    Comment: (NOTE) The Xpert Xpress SARS-CoV-2/FLU/RSV plus assay is intended as an aid in the diagnosis of influenza from Nasopharyngeal swab specimens and should not be  used as a sole basis for treatment. Nasal washings and aspirates are unacceptable for Xpert Xpress SARS-CoV-2/FLU/RSV testing.  Fact Sheet for Patients: EntrepreneurPulse.com.au  Fact Sheet for Healthcare Providers: IncredibleEmployment.be  This test is not yet approved or cleared by the [place] FDA and has been authorized for detection and/or diagnosis of SARS-CoV-2 by FDA under an Emergency Use Authorization (EUA). This EUA will remain in effect (meaning this test can be used) for the duration of the COVID-19 declaration under Section 564(b)(1) of the Act, 21 U.S.C. section 360bbb-3(b)(1), unless the authorization is terminated or revoked.  Performed at [facility], [place] [address] [place], [place] [address]   POC SARS Coronavirus 2 Ag-ED - Nasal Swab (BD Veritor Kit)     Status: None   Collection Time: 03/08/21  4:38 PM  Result Value Ref Range   SARS Coronavirus 2 Ag Negative Negative  Urinalysis, Routine w reflex microscopic Urine, Clean Catch     Status: Abnormal   Collection Time: 03/08/21  4:44 PM  Result Value Ref Range   Color, Urine STRAW (A) YELLOW   APPearance CLEAR CLEAR  Specific Gravity, Urine 1.004 (L) 1.005 - 1.030   pH 8.0 5.0 - 8.0   Glucose, UA NEGATIVE NEGATIVE mg/dL   Hgb urine dipstick NEGATIVE NEGATIVE   Bilirubin Urine NEGATIVE NEGATIVE   Ketones, ur NEGATIVE NEGATIVE mg/dL   Protein, ur NEGATIVE NEGATIVE mg/dL   Nitrite NEGATIVE NEGATIVE   Leukocytes,Ua NEGATIVE NEGATIVE    Comment: Performed at [facility] [address] [place], [place] [address]  Pregnancy, urine     Status: None   Collection Time: 03/08/21  4:44 PM  Result Value Ref Range   Preg Test, Ur NEGATIVE NEGATIVE    Comment:        THE SENSITIVITY OF THIS METHODOLOGY IS >20 mIU/mL. Performed at [facility], [place] [address] [place], [place] [address]   POCT Urine Drug Screen - (ICup)     Status: Abnormal    Collection Time: 03/08/21  4:44 PM  Result Value Ref Range   POC Amphetamine UR None Detected NONE DETECTED (Cut Off Level 1000 ng/mL)   POC Secobarbital (BAR) None Detected NONE DETECTED (Cut Off Level 300 ng/mL)   POC Buprenorphine (BUP) None Detected NONE DETECTED (Cut Off Level 10 ng/mL)   POC Oxazepam (BZO) Positive (A) NONE DETECTED (Cut Off Level 300 ng/mL)   POC Cocaine UR None Detected NONE DETECTED (Cut Off Level 300 ng/mL)   POC Methamphetamine UR None Detected NONE DETECTED (Cut Off Level 1000 ng/mL)   POC Morphine None Detected NONE DETECTED (Cut Off Level 300 ng/mL)   POC Oxycodone UR None Detected NONE DETECTED (Cut Off Level 100 ng/mL)   POC Methadone UR None Detected NONE DETECTED (Cut Off Level 300 ng/mL)   POC Marijuana UR None Detected NONE DETECTED (Cut Off Level 50 ng/mL)  Hemoglobin A1c     Status: None   Collection Time: 03/08/21  4:52 PM  Result Value Ref Range   Hgb A1c MFr Bld 5.2 4.8 - 5.6 %    Comment: (NOTE) Pre diabetes:          5.7%-6.4%  Diabetes:              >6.4%  Glycemic control for   <7.0% adults with diabetes    Mean Plasma Glucose 102.54 mg/dL    Comment: Performed at [facility], [place] [address], [place], [place] [address]  Ethanol     Status: None   Collection Time: 03/08/21  4:52 PM  Result Value Ref Range   Alcohol, Ethyl (B) <10 <10 mg/dL    Comment: (NOTE) Lowest detectable limit for serum alcohol is 10 mg/dL.  For medical purposes only. Performed at [facility], [place] [address], [place], [place] [address]   Lipid panel     Status: None   Collection Time: 03/08/21  4:52 PM  Result Value Ref Range   Cholesterol 150 0 - 200 mg/dL   Triglycerides 105 <150 mg/dL   HDL 42 >40 mg/dL   Total CHOL/HDL Ratio 3.6 RATIO   VLDL 21 0 - 40 mg/dL   LDL Cholesterol 87 0 - 99 mg/dL    Comment:        Total Cholesterol/HDL:CHD Risk Coronary Heart Disease Risk Table                     Men   Women  1/2 Average Risk   3.4    3.3  Average Risk       5.0   4.4  2 X Average Risk   9.6   7.1  3 X Average Risk  23.4   11.0        Use the calculated Patient Ratio above and the CHD Risk Table to determine the patient's CHD Risk.        ATP III CLASSIFICATION (LDL):  <100     mg/dL   Optimal  100-129  mg/dL   Near or Above                    Optimal  130-159  mg/dL   Borderline  160-189  mg/dL   High  >190     mg/dL   Very High Performed at [facility] [address] [place], [place] [address]   TSH     Status: None   Collection Time: 03/08/21  4:52 PM  Result Value Ref Range   TSH 0.822 0.350 - 4.500 uIU/mL    Comment: Performed by a 3rd Generation assay with a functional sensitivity of <=0.01 uIU/mL. Performed at [facility], [place] [address], [place], [place] [address]   Pregnancy, urine POC     Status: None   Collection Time: 03/08/21  4:52 PM  Result Value Ref Range   Preg Test, Ur NEGATIVE NEGATIVE    Comment:        THE SENSITIVITY OF THIS METHODOLOGY IS >24 mIU/mL     Blood Alcohol level:  Lab Results  Component Value Date   ETH <10 32/09/2481    Metabolic Disorder Labs: Lab Results  Component Value Date   HGBA1C 5.2 03/08/2021   MPG 102.54 03/08/2021   No results found for: PROLACTIN Lab Results  Component Value Date   CHOL 150 03/08/2021   TRIG 105 03/08/2021   HDL 42 03/08/2021   CHOLHDL 3.6 03/08/2021   VLDL 21 03/08/2021   LDLCALC 87 03/08/2021    Physical Findings: AIMS:  , ,  ,  ,    CIWA:    COWS:     Musculoskeletal: Strength & Muscle Tone: within normal limits Gait & Station: normal Patient leans: N/A  Psychiatric Specialty Exam:  Presentation  General Appearance: Appropriate for Environment; Casual  Eye Contact:Good  Speech:Clear and Coherent; Normal Rate  Speech Volume:Normal  Handedness:Right   Mood and Affect  Mood:Anxious  Affect:Full Range   Thought Process  Thought Processes:Coherent; Goal Directed  Descriptions of  Associations:Intact  Orientation:Full (Time, Place and Person)  Thought Content:Logical  History of Schizophrenia/Schizoaffective disorder:No  Duration of Psychotic Symptoms:No data recorded Hallucinations:Hallucinations: None  Ideas of Reference:None  Suicidal Thoughts:Suicidal Thoughts: No SI Active Intent and/or Plan: -- (Denies active SI today.  Chart notes document that patient reported thoughts of overdosing on medications yesterday.) SI Passive Intent and/or Plan: Without Intent; Without Plan  Homicidal Thoughts:Homicidal Thoughts: No   Sensorium  Memory:Immediate Good; Recent Fair; Remote Good  Judgment:Fair  Insight:Fair   Executive Functions  Concentration:Good  Attention Span:Good  [place]  Language:Good   Psychomotor Activity  Psychomotor Activity:Psychomotor Activity: Normal   Assets  Assets:Communication Skills; Desire for Improvement; Financial Resources/Insurance; Housing; Physical Health; Resilience; Social Support   Sleep  Sleep:Sleep: Good Number of Hours of Sleep: 6.75    Physical Exam: Physical Exam Vitals and nursing note reviewed.  HENT:     Head: Normocephalic and atraumatic.  Pulmonary:     Effort: Pulmonary effort is normal.  Neurological:     General: No focal deficit present.  Mental Status: She is alert and oriented to person, place, and time.    Review of Systems  Constitutional: Negative for diaphoresis and fever.  Respiratory: Negative for cough and shortness of breath.   Cardiovascular: Negative for chest pain and palpitations.  Gastrointestinal: Negative.   Musculoskeletal: Negative.   Neurological: Negative.   Psychiatric/Behavioral: Negative for depression, hallucinations and suicidal ideas. The patient is nervous/anxious. The patient does not have insomnia.    Blood pressure 119/83, pulse 88, temperature 98.4 F (36.9 C), temperature source Oral, resp. rate 16, height _0   (1.702 m), weight 74.6 kg, SpO2 100 %. Body mass index is 25.76 kg/m.   Treatment Plan Summary: Daily contact with patient to assess and evaluate symptoms and progress in treatment and Medication management   Continue on IVC status  Encouraged participation in group therapy and therapeutic milieu.  Depression/Anxiety -Continue duloxetine 35m daily for anxiety and depression -Continue Xanax 0.266mQHS for anxiety and sleep  Insomnia -Continue melatonin 3 mg QHS -Continue trazodone 50 mg QHS PRN  Disposition planning in progress.   -[facility]-Social work needs to do saLandrior to discharge   MaArthor CaptainMD 03/10/2021, 4:27 PM

## 2021-03-10 NOTE — Progress Notes (Cosign Needed)
Adult Psychoeducational Group Note  Date:  03/10/2021 Time:  5:07 PM  Group Topic/Focus:  Building Self Esteem:   The Focus of this group is helping patients become aware of the effects of self-esteem on their lives, the things they and others do that enhance or undermine their self-esteem, seeing the relationship between their level of self-esteem and the choices they make and learning ways to enhance self-esteem.  Participation Level:  Active  Participation Quality:  Appropriate  Affect:  Appropriate  Cognitive:  Alert  Insight: Appropriate  Engagement in Group:  Engaged  Modes of Intervention:  Discussion  Additional Comments:  Pt attended group and participated in discussion.  Mabell Esguerra R Rondal Vandevelde 03/10/2021, 5:07 PM

## 2021-03-10 NOTE — Progress Notes (Signed)
Recreation Therapy Notes  Date: 03/10/2021 Time: 900a  Topic: Stress Management   Goal Area(s) Addresses:  Patient will review and complete packet supporting identification of stressors and and techniques to combat compounding stress.   Intervention: Independent Workbook   Education: Stress, Time Management, Coping Skills, Lifestyle Changes, Discharge Planning   Comments: LRT provided pt a workbook reviewing stress management concepts and offering an opportunity to create a plan to address stressors. Pt given the option to complete the packet in the dayroom with RN/MHT staff and peers or at their own pace in their room.   Jaysion Ramseyer, LRT/CTRS 

## 2021-03-10 NOTE — Tx Team (Signed)
Interdisciplinary Treatment and Diagnostic Plan Update  03/10/2021 Time of Session: [name] MRN: [id number]  Principal Diagnosis: Severe recurrent major depression without psychotic features [facility])  Secondary Diagnoses: Principal Problem:   Severe recurrent major depression without psychotic features ([place]) Active Problems:   GAD (generalized anxiety disorder)   Panic disorder   Current Medications:  Current Facility-Administered Medications  Medication Dose Route Frequency Provider Last Rate Last Admin  . acetaminophen (TYLENOL) tablet 650 mg  650 mg Oral Q6H PRN Lindon Romp A, NP      . ALPRAZolam Duanne Moron) tablet 0.25 mg  0.25 mg Oral QHS PRN Lindon Romp A, NP   0.25 mg at 03/09/21 2142  . alum & mag hydroxide-simeth (MAALOX/MYLANTA) 200-200-20 MG/5ML suspension 30 mL  30 mL Oral Q4H PRN Rozetta Nunnery, NP      . DULoxetine (CYMBALTA) DR capsule 60 mg  60 mg Oral Daily Lindon Romp A, NP   60 mg at 03/09/21 1903  . hydrOXYzine (ATARAX/VISTARIL) tablet 25 mg  25 mg Oral TID PRN Arthor Captain, MD      . magnesium hydroxide (MILK OF MAGNESIA) suspension 30 mL  30 mL Oral Daily PRN Lindon Romp A, NP      . melatonin tablet 3 mg  3 mg Oral QHS Arthor Captain, MD   3 mg at 03/09/21 2142  . ondansetron (ZOFRAN) tablet 4 mg  4 mg Oral Q6H PRN Arthor Captain, MD      . traZODone (DESYREL) tablet 50 mg  50 mg Oral QHS PRN Arthor Captain, MD       PTA Medications: Medications Prior to Admission  Medication Sig Dispense Refill Last Dose  . albuterol (PROAIR HFA) 108 (90 Base) MCG/ACT inhaler Inhale 2 puffs into the lungs every 6 (six) hours as needed.     . ALPRAZolam (XANAX) 0.25 MG tablet Take 0.25 mg by mouth at bedtime.     . DULoxetine (CYMBALTA) 20 MG capsule Take 40 mg by mouth daily.       Patient Stressors: Loss of relationship with a Boyfriend  Patient Strengths: Ability for insight Communication skills Motivation for treatment/growth Supportive  family/friends  Treatment Modalities: Medication Management, Group therapy, Case management,  1 to 1 session with clinician, Psychoeducation, Recreational therapy.   Physician Treatment Plan for Primary Diagnosis: Severe recurrent major depression without psychotic features ([place]) Long Term Goal(s): Improvement in symptoms so as ready for discharge Improvement in symptoms so as ready for discharge   Short Term Goals: Ability to identify changes in lifestyle to reduce recurrence of condition will improve Ability to verbalize feelings will improve Ability to disclose and discuss suicidal ideas Ability to demonstrate self-control will improve Ability to identify and develop effective coping behaviors will improve Ability to identify triggers associated with substance abuse/mental health issues will improve Ability to identify changes in lifestyle to reduce recurrence of condition will improve Ability to verbalize feelings will improve Ability to disclose and discuss suicidal ideas Ability to demonstrate self-control will improve Ability to identify and develop effective coping behaviors will improve Ability to identify triggers associated with substance abuse/mental health issues will improve  Medication Management: Evaluate patient's response, side effects, and tolerance of medication regimen.  Therapeutic Interventions: 1 to 1 sessions, Unit Group sessions and Medication administration.  Evaluation of Outcomes: Not Met  Physician Treatment Plan for Secondary Diagnosis: Principal Problem:   Severe recurrent major depression without psychotic features ([place]) Active Problems:   GAD (generalized anxiety disorder)  Panic disorder  Long Term Goal(s): Improvement in symptoms so as ready for discharge Improvement in symptoms so as ready for discharge   Short Term Goals: Ability to identify changes in lifestyle to reduce recurrence of condition will improve Ability to verbalize feelings  will improve Ability to disclose and discuss suicidal ideas Ability to demonstrate self-control will improve Ability to identify and develop effective coping behaviors will improve Ability to identify triggers associated with substance abuse/mental health issues will improve Ability to identify changes in lifestyle to reduce recurrence of condition will improve Ability to verbalize feelings will improve Ability to disclose and discuss suicidal ideas Ability to demonstrate self-control will improve Ability to identify and develop effective coping behaviors will improve Ability to identify triggers associated with substance abuse/mental health issues will improve     Medication Management: Evaluate patient's response, side effects, and tolerance of medication regimen.  Therapeutic Interventions: 1 to 1 sessions, Unit Group sessions and Medication administration.  Evaluation of Outcomes: Not Met   RN Treatment Plan for Primary Diagnosis: Severe recurrent major depression without psychotic features ([place]) Long Term Goal(s): Knowledge of disease and therapeutic regimen to maintain health will improve  Short Term Goals: Ability to disclose and discuss suicidal ideas, Ability to identify and develop effective coping behaviors will improve and Compliance with prescribed medications will improve  Medication Management: RN will administer medications as ordered by provider, will assess and evaluate patient's response and provide education to patient for prescribed medication. RN will report any adverse and/or side effects to prescribing provider.  Therapeutic Interventions: 1 on 1 counseling sessions, Psychoeducation, Medication administration, Evaluate responses to treatment, Monitor vital signs and CBGs as ordered, Perform/monitor CIWA, COWS, AIMS and Fall Risk screenings as ordered, Perform wound care treatments as ordered.  Evaluation of Outcomes: Not Met   LCSW Treatment Plan for Primary  Diagnosis: Severe recurrent major depression without psychotic features ([place]) Long Term Goal(s): Safe transition to appropriate next level of care at discharge, Engage patient in therapeutic group addressing interpersonal concerns.  Short Term Goals: Engage patient in aftercare planning with referrals and resources, Increase social support and Increase ability to appropriately verbalize feelings  Therapeutic Interventions: Assess for all discharge needs, 1 to 1 time with Social worker, Explore available resources and support systems, Assess for adequacy in community support network, Educate family and significant other(s) on suicide prevention, Complete Psychosocial Assessment, Interpersonal group therapy.  Evaluation of Outcomes: Not Met   Progress in Treatment: Attending groups: Yes. Participating in groups: Yes. Taking medication as prescribed: Yes. Toleration medication: Yes. Family/Significant other contact made: No, will contact:  mother Patient understands diagnosis: Yes. Discussing patient identified problems/goals with staff: Yes. Medical problems stabilized or resolved: Yes. Denies suicidal/homicidal ideation: Yes. Issues/concerns per patient self-inventory: No. Other: None  New problem(s) identified: No, Describe:  None  New Short Term/Long Term Goal(s):medication stabilization, elimination of SI thoughts, development of comprehensive mental wellness plan.  Patient Goals:  "Use my DBT skills better."  Discharge Plan or Barriers: Patient recently admitted. CSW will continue to follow and assess for appropriate referrals and possible discharge planning.  Reason for Continuation of Hospitalization: Depression Medication stabilization Suicidal ideation  Estimated Length of Stay: 3-5 days  Attendees: Patient: April Harding 03/10/2021   Physician: Ethelene Browns, MD 03/10/2021   Nursing:  03/10/2021   RN Care Manager: 03/10/2021   Social Worker: Toney Reil, Latanya Presser 03/10/2021    Recreational Therapist:  03/10/2021   Other:  03/10/2021   Other:  03/10/2021  Other: 03/10/2021    Scribe for Treatment Team: Mliss Fritz, Latanya Presser 03/10/2021 11:46 AM

## 2021-03-10 NOTE — Progress Notes (Signed)
   03/09/21 2000  Psych Admission Type (Psych Patients Only)  Admission Status Voluntary  Psychosocial Assessment  Patient Complaints Anxiety;Depression  Eye Contact Fair  Facial Expression Anxious  Affect Depressed  Speech Logical/coherent;Slow  Interaction Minimal  Motor Activity Slow  Appearance/Hygiene Unremarkable  Behavior Characteristics Cooperative;Guarded  Mood Anxious  Thought Process  Coherency WDL  Content WDL  Delusions WDL  Perception WDL  Hallucination None reported or observed  Judgment WDL  Confusion WDL  Danger to Self  Current suicidal ideation? Denies  Danger to Others  Danger to Others None reported or observed   Patient compliant with treatment requested for prn Xanax for anxiety at HS, Xanax 0.2mg  given at 2142 and effective by 2240. Support and encouragement provided as needed. Patient denies SI/HI/A/V/H.  Q15 minutes safety checks ongoing. Pt. Remains safe.
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
## 2021-03-10 NOTE — Progress Notes (Addendum)
D:  Patient's self inventory sheet, patient sleeps good, takes melatonin at home helps her sleep.  Good appetite, normal energy level, good concentration.  Denied withdrawals.  Denied depression and hopeless.  Rated anxiety #3.  Denied withdrawals.  Denied SI.  Denied physical problems.  Denied physical problems.  Denied pain.  Goal is to continue using DOT skills.  Pray with family.  Does have discharge plans.   A:  Patient stated she takes her medications at night.  Emotional support and encouragement given patient. R:  Denied SI and HI, contracts for safety.  Denied A/V hallucinations.  Safety maintained with 15 minute checks.

## 2021-03-10 NOTE — BHH Group Notes (Signed)
Type of Therapy and Topic: Group Therapy: Overcoming Obstacles  Participation Level: Active  Description of Group:  In this group patients will watch a Ted Talk titled, "Why We Should All Try Therapy". In this video, patients explore the benefits of going to therapy and how this choice may effect their life. This video also explores other emotions that may arise by attending therapy. Patients were also given a worksheet titled "Therapy Goals" that allows patients to explore their goals for therapy and how their life may be different once they have completed therapy.    Therapeutic Goals:  1. Patient will identify personal and current emotions as they relate to therapy.  2. Patient will identify barriers that currently interfere with them beginning therapy.  3. Patient will identify two goals they are willing to set for therapy.     Summary of Patient Progress: Pt introduced herself without needing to be prompted and stated that she was happy that she got to talk to her mother. Pt was attentive during the TedTalk and participated in the discussion.   Fredirick Lathe, LCSWA Clinicial Social Worker [employer]

## 2021-03-11 MED ORDER — DULOXETINE HCL 60 MG PO CPEP: 60.0000 mg | ORAL_CAPSULE | Freq: Every day | ORAL | 0 refills | Status: DC

## 2021-03-11 MED ORDER — TRAZODONE HCL 50 MG PO TABS: 50.0000 mg | ORAL_TABLET | Freq: Every evening | ORAL | 0 refills | Status: DC | PRN

## 2021-03-11 MED ORDER — HYDROXYZINE HCL 25 MG PO TABS: 25.0000 mg | ORAL_TABLET | Freq: Three times a day (TID) | ORAL | 0 refills | Status: DC | PRN

## 2021-03-11 NOTE — Discharge Summary (Signed)
Physician Discharge Summary Note  Patient:  Ovella Manygoats is an 20 y.o., female MRN:  [id number] DOB:  [birth year] Patient phone:  [phone] (home)  Patient address:   [address] [place] [facility] [address],  Total Time spent with patient: 15 minutes  Date of Admission:  03/09/2021 Date of Discharge: 03/11/2021  Reason for Admission:  Per admission assessment note:" Telia Amundson a 20 year old female with reported past psychiatric history of panic disorder, generalized anxiety disorder, depression and PTSD who presented as a walk-in accompanied by her mother at [facility] with a chief complaint of suicidal ideation with a plan to overdose on medications. At the [facility] the patient reported that she had a recent bad break-up and felt like she had no purpose. She has been attending partial hospital program at [facility] since mid April. Notes from [facility] indicate that patient told her therapist at [facility] on 03/08/2021 that she was having suicidal thoughts with ideas of taking pills and therapist encouraged patient and patient's mother to present for evaluation. [facility] staff contacted patient's mother who expressed significant worry about patient's wellbeing. Notes document that mother reported that patient had pills in her room and mother was concerned about patient's safety."  Principal Problem: Severe recurrent major depression without psychotic features [facility]) Discharge Diagnoses: Principal Problem:   Severe recurrent major depression without psychotic features (HCC) Active Problems:   GAD (generalized anxiety disorder)   Panic disorder   Past Psychiatric History:   Past Medical History:  Past Medical History:  Diagnosis Date  . Anxiety   . Concussion    multiple  . Depression   . GAD (generalized anxiety disorder)   . Head injury    w/LOC  . MDD (major depressive disorder)   . Migraine   . PTSD (post-traumatic stress  disorder)    d/t divorce in childhood    Past Surgical History:  Procedure Laterality Date  . TONSILLECTOMY AND ADENOIDECTOMY    . TYMPANOSTOMY TUBE PLACEMENT    . URETHRAL DILATION     Family History:  Family History  Problem Relation Age of Onset  . Atrial fibrillation Father   . Sudden death Neg Hx   . Hyperlipidemia Neg Hx   . Hypertension Neg Hx   . Heart attack Neg Hx   . Diabetes Neg Hx    Family Psychiatric  History:  Social History:  Social History   Substance and Sexual Activity  Alcohol Use No     Social History   Substance and Sexual Activity  Drug Use No    Social History   Socioeconomic History  . Marital status: Single    Spouse name: Not on file  . Number of children: 0  . Years of education: 23  . Highest education level: Not on file  Occupational History    Comment: [employer]  Tobacco Use  . Smoking status: Never Smoker  . Smokeless tobacco: Never Used  Substance and Sexual Activity  . Alcohol use: No  . Drug use: No  . Sexual activity: Never  Other Topics Concern  . Not on file  Social History Narrative   living with mom   Social Determinants of Health   Financial Resource Strain: Not on file  Food Insecurity: Not on file  Transportation Needs: Not on file  Physical Activity: Not on file  Stress: Not on file  Social Connections: Not on file    Hospital Course: Arlethia Basso was admitted for Severe recurrent major  depression without psychotic features (HCC) and  crisis management.  Pt was treated discharged with the medications listed below under Medication List.  Medical problems were identified and treated as needed.  Home medications were restarted as appropriate.  Improvement was monitored by observation and Annisten Manchester 's daily report of symptom reduction.  Emotional and mental status was monitored by daily self-inventory reports completed by [name] and clinical staff.         Nazareth Norenberg was evaluated by the  treatment team for stability and plans for continued recovery upon discharge. Karmon Andis 's motivation was an integral factor for scheduling further treatment. Employment, transportation, bed availability, health status, family support, and any pending legal issues were also considered during hospital stay. Pt was offered further treatment options upon discharge including but not limited to Residential, Intensive Outpatient, and Outpatient treatment.  Alawna Graybeal will follow up with the services as listed below under Follow Up Information.     Upon completion of this admission the patient was both mentally and medically stable for discharge denying suicidal/homicidal ideation, auditory or visual hallucinations    Lailana Shira responded well to treatment with Cymbalta 20 mg to 60 mg at discaharge and  Xanax 25 mg nightly. without adverse effects. Neelie demonstrated improvement without reported or observed adverse effects to the point of stability appropriate for outpatient management. Pertinent labs include: +oxazepam and urnalysis  for which outpatient follow-up is necessary for lab recheck as mentioned below. Reviewed CBC, CMP, BAL, and UDS; all unremarkable aside from noted exceptions.   Physical Findings: AIMS:  , ,  ,  ,    CIWA:    COWS:     Musculoskeletal: Strength & Muscle Tone: within normal limits Gait & Station: normal Patient leans: N/A   Psychiatric Specialty Exam:  Presentation  General Appearance: Appropriate for Environment; Casual  Eye Contact:Good  Speech:Clear and Coherent; Normal Rate  Speech Volume:Normal  Handedness:Right   Mood and Affect  Mood:Anxious  Affect:Full Range   Thought Process  Thought Processes:Coherent; Goal Directed  Descriptions of Associations:Intact  Orientation:Full (Time, Place and Person)  Thought Content:Logical  History of Schizophrenia/Schizoaffective disorder:No  Duration of Psychotic Symptoms:No data  recorded Hallucinations:Hallucinations: None  Ideas of Reference:None  Suicidal Thoughts:Suicidal Thoughts: No  Homicidal Thoughts:Homicidal Thoughts: No   Sensorium  Memory:Immediate Good; Recent Fair; Remote Good  Judgment:Fair  Insight:Fair   Executive Functions  Concentration:Good  Attention Span:Good  Recall:Good  Fund of Knowledge:Fair  Language:Good   Psychomotor Activity  Psychomotor Activity:Psychomotor Activity: Normal   Assets  Assets:Communication Skills; Desire for Improvement; Financial Resources/Insurance; Housing; Physical Health; Resilience; Social Support   Sleep  Sleep:Sleep: Good Number of Hours of Sleep: 6.75    Physical Exam: Physical Exam Vitals reviewed.  Cardiovascular:     Rate and Rhythm: Normal rate and regular rhythm.  Neurological:     Mental Status: She is alert.  Psychiatric:        Attention and Perception: Attention normal.        Mood and Affect: Mood is anxious. Mood is not depressed.        Behavior: Behavior normal. Behavior is cooperative.        Thought Content: Thought content normal.        Cognition and Memory: Cognition normal.        Judgment: Judgment normal.    ROS Blood pressure 119/83, pulse 88, temperature 98.4 F (36.9 C), temperature source Oral, resp. rate 16, height 5\' 7"  (1.702 m), weight  74.6 kg, SpO2 100 %. Body mass index is 25.76 kg/m.      Has this patient used any form of tobacco in the last 30 days? (Cigarettes, Smokeless Tobacco, Cigars, and/or Pipes) Yes, No  Blood Alcohol level:  Lab Results  Component Value Date   ETH <10 03/08/2021    Metabolic Disorder Labs:  Lab Results  Component Value Date   HGBA1C 5.2 03/08/2021   MPG 102.54 03/08/2021   No results found for: PROLACTIN Lab Results  Component Value Date   CHOL 150 03/08/2021   TRIG 105 03/08/2021   HDL 42 03/08/2021   CHOLHDL 3.6 03/08/2021   VLDL 21 03/08/2021   LDLCALC 87 03/08/2021    See Psychiatric  Specialty Exam and Suicide Risk Assessment completed by Attending Physician prior to discharge.  Discharge destination:  Home with follow-up at [facility]   Is patient on multiple antipsychotic therapies at discharge:  No   Has Patient had three or more failed trials of antipsychotic monotherapy by history:  No  Recommended Plan for Multiple Antipsychotic Therapies: NA  Discharge Instructions    Diet - low sodium heart healthy   Complete by: As directed    Discharge instructions   Complete by: As directed    Take all medications as prescribed. Keep all follow-up appointments as scheduled.  Do not consume alcohol or use illegal drugs while on prescription medications. Report any adverse effects from your medications to your primary care provider promptly.  In the event of recurrent symptoms or worsening symptoms, call 911, a crisis hotline, or go to the nearest emergency department for evaluation.   Increase activity slowly   Complete by: As directed      Allergies as of 03/11/2021      Reactions   Bee Venom Shortness Of Breath, Swelling   Reglan [metoclopramide] Anxiety   Caine-1 [lidocaine] Swelling   Novacaine      Medication List    TAKE these medications     Indication  ALPRAZolam 0.25 MG tablet Commonly known as: XANAX Take 0.25 mg by mouth at bedtime.  Indication: Feeling Anxious   DULoxetine 60 MG capsule Commonly known as: CYMBALTA Take 1 capsule (60 mg total) by mouth daily. Start taking on: Mar 12, 2021 What changed:   medication strength  how much to take  Indication: Major Depressive Disorder   hydrOXYzine 25 MG tablet Commonly known as: ATARAX/VISTARIL Take 1 tablet (25 mg total) by mouth 3 (three) times daily as needed for anxiety.  Indication: Feeling Anxious   ProAir HFA 108 (90 Base) MCG/ACT inhaler Generic drug: albuterol Inhale 2 puffs into the lungs every 6 (six) hours as needed.  Indication: Asthma   traZODone 50 MG tablet Commonly  known as: DESYREL Take 1 tablet (50 mg total) by mouth at bedtime as needed (give for sleep if PRN alprazolam is not effective).  Indication: Anxiety Disorder, Trouble Sleeping       Follow-up Information    [facility]. Call on 03/13/2021.   Why: Please return to this provider for the partial hospitalization program upon discharge. Contact information: [address] [place], [place] [address]  Phone: [phone], [phone], ext. [phone]              Follow-up recommendations:  Activity:  as tolerated' Diet:  heart healthy   Comments:  Take all medications as prescribed. Keep all follow-up appointments as scheduled.  Do not consume alcohol or use illegal drugs  while on prescription medications. Report any adverse effects from your medications to your primary care provider promptly.  In the event of recurrent symptoms or worsening symptoms, call 911, a crisis hotline, or go to the nearest emergency department for evaluation.   Signed: Oneta Rackanika N Jaden Abreu, NP 03/11/2021, 9:03 AM

## 2021-03-11 NOTE — Progress Notes (Signed)
RN met with pt and reviewed pt's discharge instructions. Pt verbalized understanding of discharge instructions and pt did not have any questions. RN reviewed and provided pt with a copy of SRA, AVS and Transition Record. RN returned pt's belongings to pt.  Medication samples were given to pt. Pt denied SI/HI/AVH and voiced no concerns. Pt was appreciative of the care pt received at [facility]. Patient discharged to the lobby without incident.  ?

## 2021-03-11 NOTE — [facility] Group Notes (Signed)
.  Psychoeducational Group Note  Date: 03/11/2021 Time: 0900-1000    Goal Setting   Purpose of Group: This group helps to provide patients with the steps of setting a goal that is specific, measurable, attainable, realistic and time specific. A discussion on how we keep ourselves stuck with negative self talk.    Participation Level:  Active  Participation Quality:  Appropriate  Affect:  Appropriate  Cognitive:  Appropriate  Insight:  Improving  Engagement in Group:  Engaged  Additional Comments:  Rates her energy at a 7/10. Was insightful in the group and responded to others with positive statements  Dione Housekeeper

## 2021-03-11 NOTE — Progress Notes (Signed)
Patient compliant with treatment focused on leaving stating she does not feel safe here she would rather go home and do out Patient treatment. Patient requested Xanax prn at hs for sleep and anxiety. Medication effective by 2215. Support and encouragement provided. Q 15 minutes safety checks ongoing without self harm gestures.

## 2021-03-11 NOTE — [facility] Suicide Risk Assessment (Signed)
[facility] INPATIENT:  Family/Significant Other Suicide Prevention Education  Suicide Prevention Education:  Education Completed; [name] (mom) [phone],  (name of family member/significant other) has been identified by the patient as the family member/significant other with whom the patient will be residing, and identified as the person(s) who will aid the patient in the event of a mental health crisis (suicidal ideations/suicide attempt).  With written consent from the patient, the family member/significant other has been provided the following suicide prevention education, prior to the and/or following the discharge of the patient.  Mother has locked up all medicines and will administer her medications when she is supposed to take them.  Mother explained the entire discharge plan with Garen Lah to CSW.  The patient will be with mother or step dad at all times while not attending classes.  The suicide prevention education provided includes the following:  Suicide risk factors  Suicide prevention and interventions  National Suicide Hotline telephone number  [facility] assessment telephone number  [facility] Emergency Assistance 911  [facility] and/or Residential Mobile Crisis Unit telephone number  Request made of family/significant other to:  Remove weapons (e.g., guns, rifles, knives), all items previously/currently identified as safety concern.    Remove drugs/medications (over-the-counter, prescriptions, illicit drugs), all items previously/currently identified as a safety concern.  The family member/significant other verbalizes understanding of the suicide prevention education information provided.  The family member/significant other agrees to remove the items of safety concern listed above.  Carloyn Jaeger Grossman-Orr 03/11/2021, 9:45 AM

## 2021-03-11 NOTE — [facility] Suicide Risk Assessment (Signed)
[facility] Discharge Suicide Risk Assessment   Principal Problem: Severe recurrent major depression without psychotic features [facility]) Discharge Diagnoses: Principal Problem:   Severe recurrent major depression without psychotic features (HCC) Active Problems:   GAD (generalized anxiety disorder)   Panic disorder   Total Time spent with patient: 15 minutes  Musculoskeletal: Strength & Muscle Tone: within normal limits Gait & Station: normal Patient leans: N/A  Psychiatric Specialty Exam  Presentation  General Appearance: Fairly Groomed  Eye Contact:Good  Speech:Normal Rate; Clear and Coherent  Speech Volume:Normal  Handedness:Right   Mood and Affect  Mood:Euthymic  Duration of Depression Symptoms: Greater than two weeks  Affect:Appropriate   Thought Process  Thought Processes:Coherent  Descriptions of Associations:Intact  Orientation:Full (Time, Place and Person)  Thought Content:Logical  History of Schizophrenia/Schizoaffective disorder:No  Duration of Psychotic Symptoms:No data recorded Hallucinations:Hallucinations: None  Ideas of Reference:None  Suicidal Thoughts:Suicidal Thoughts: No  Homicidal Thoughts:Homicidal Thoughts: No   Sensorium  Memory:Immediate Good; Recent Good; Remote Good  Judgment:Good  Insight:Good   Executive Functions  Concentration:Good  Attention Span:Good  Recall:Good  Fund of Knowledge:Good  Language:Good   Psychomotor Activity  Psychomotor Activity:Psychomotor Activity: Normal   Assets  Assets:Communication Skills; Desire for Improvement; Financial Resources/Insurance; Social Support; Resilience; Talents/Skills; Transportation; Housing   Sleep  Sleep:Sleep: Good Number of Hours of Sleep: 6.6   Physical Exam: Physical Exam Vitals and nursing note reviewed.  Constitutional:      Appearance: Normal appearance.  HENT:     Head: Normocephalic and atraumatic.  Pulmonary:     Effort: Pulmonary effort is  normal.  Neurological:     General: No focal deficit present.     Mental Status: She is alert and oriented to person, place, and time.    ROS Blood pressure 119/83, pulse 88, temperature 98.4 F (36.9 C), temperature source Oral, resp. rate 16, height 5\' 7"  (1.702 m), weight 74.6 kg, SpO2 100 %. Body mass index is 25.76 kg/m.  Mental Status Per Nursing Assessment::   On Admission:  Suicidal ideation indicated by patient  Demographic Factors:  Caucasian and Unemployed  Loss Factors: NA  Historical Factors: Impulsivity  Risk Reduction Factors:   Sense of responsibility to family, Living with another person, especially a relative, Positive social support and Positive therapeutic relationship  Continued Clinical Symptoms:  Severe Anxiety and/or Agitation Depression:   Impulsivity  Cognitive Features That Contribute To Risk:  None    Suicide Risk:  Minimal: No identifiable suicidal ideation.  Patients presenting with no risk factors but with morbid ruminations; may be classified as minimal risk based on the severity of the depressive symptoms   Follow-up Information    [facility]. Call on 03/13/2021.   Why: Please return to this provider for the partial hospitalization program upon discharge. Contact information: [address] [place], [place]  Phone: [phone], [phone], ext. [phone]              Plan Of Care/Follow-up recommendations:  Activity:  ad lib  2007, MD 03/11/2021, 9:14 AM

## 2021-03-11 NOTE — Progress Notes (Signed)
  [facility] Adult Case Management Discharge Plan :  Will you be returning to the same living situation after discharge:  Yes,  with mother At discharge, do you have transportation home?: Yes,  mother Do you have the ability to pay for your medications: Yes,  has insurance  Release of information consent forms completed and emailed to Medical Records, then turned in to Medical Records by CSW.   Patient to Follow up at:  Follow-up Information    [facility]. Call on 03/13/2021.   Why: Please return to this provider for the partial hospitalization program on Monday 03/13/2021. Contact information: [address] [place], [place] [address]  Phone: [phone], [phone], ext. [phone]              Next level of care provider has access to [facility] Link:no  Safety Planning and Suicide Prevention discussed: Yes,  with mother     Has patient been referred to the Quitline?: N/A patient is not a smoker  Patient has been referred for addiction treatment: N/A  Lynnell Chad, LCSW 03/11/2021, 9:48 AM

## 2021-03-22 ENCOUNTER — Telehealth: Payer: Self-pay | Admitting: Diagnostic Neuroimaging

## 2021-03-22 DIAGNOSIS — F0781 Postconcussional syndrome: Secondary | ICD-10-CM

## 2021-03-22 NOTE — Telephone Encounter (Signed)
At 4:14 pt left a vm asking to be called re: concerns for possible seizure like activity on 05-28.  Phone rep called Pt who confirmed she did not go to the ED but would like a call from RN to discuss.  Pt declined Dr Richrd Humbles next available appointment even with wait list

## 2021-03-22 NOTE — Telephone Encounter (Signed)
Called patient who stated she's had lots of concussions, has severe anxiety panic attacks. On Sat her parents witness event  similar to panic attacks but patient stated they have never been that bad. She was repeating words, couldn't control movements, felt like a psychosis episode, doesn't remember it all, couldn't hold herself up physically. She denied  tongue biting, no loss of bowel or bladder function.  She is tapering off Cymbalta and is on sertraline 25 mg daily. She  feels out of it today, stated its like derealization and disassociation.  She asked if she needs MRI to see if she had seizure.  I advised will discuss with Dr Mahlon Gammon and let her know his reply, recommendations. Patient verbalized understanding, appreciation.

## 2021-03-22 NOTE — Telephone Encounter (Signed)
Talked with patient and informed her of Dr [employer] message and advice. She gave phone to her mother, Babette Relic who stated patient is having non-epileptic seizures, has had two. Her step son has had them so she knows what they are.  Hands jerk, lips move but she can't speak, starts crying when coming out of it, then almost goes into panic attack. Tammy stated she goes to [facility] outpatient therapy, and they told her to see neurology again or see neuropsychology. I advised Dr Danae Orleans can refer her to a neuropsychologist.  Tammy requested referral be sent to [facility], gave her their # to check on referral in several days.   she had no other questions, comments,  verbalized understanding, appreciation.

## 2021-03-22 NOTE — Telephone Encounter (Signed)
Reviewed chart and notes; recommend she touch base with psychiatry. These do not sound like seizure. -VRP

## 2021-03-23 NOTE — Telephone Encounter (Signed)
LVM informing patient that Dr Marjory Lies stated a neuropsychologist would not treat nonepileptic seizures or seizures. They mainly treat types of dementia. Dr Marjory Lies advised she continue seeing a psychiatrist, psychologist for management of her condition and medication management. Advised a referral to neuropsychology will not be placed.

## 2021-04-08 ENCOUNTER — Other Ambulatory Visit (HOSPITAL_COMMUNITY): Payer: Self-pay | Admitting: Psychiatry

## 2021-05-06 ENCOUNTER — Other Ambulatory Visit (HOSPITAL_COMMUNITY): Payer: Self-pay | Admitting: Family

## 2022-04-24 ENCOUNTER — Encounter (HOSPITAL_BASED_OUTPATIENT_CLINIC_OR_DEPARTMENT_OTHER): Payer: Self-pay | Admitting: Radiology

## 2022-04-24 ENCOUNTER — Emergency Department (HOSPITAL_BASED_OUTPATIENT_CLINIC_OR_DEPARTMENT_OTHER): Payer: No Typology Code available for payment source

## 2022-04-24 ENCOUNTER — Other Ambulatory Visit: Payer: Self-pay

## 2022-04-24 ENCOUNTER — Emergency Department (HOSPITAL_BASED_OUTPATIENT_CLINIC_OR_DEPARTMENT_OTHER)
Admission: EM | Admit: 2022-04-24 | Discharge: 2022-04-24 | Discharge status: Against Medical Advice | Payer: No Typology Code available for payment source | Attending: Emergency Medicine | Admitting: Emergency Medicine

## 2022-04-24 DIAGNOSIS — R110 Nausea: Secondary | ICD-10-CM | POA: Insufficient documentation

## 2022-04-24 DIAGNOSIS — R1030 Lower abdominal pain, unspecified: Secondary | ICD-10-CM | POA: Insufficient documentation

## 2022-04-24 DIAGNOSIS — N3000 Acute cystitis without hematuria: Secondary | ICD-10-CM | POA: Insufficient documentation

## 2022-04-24 LAB — COMPREHENSIVE METABOLIC PANEL
ALT: 8 U/L (ref 0–44)
AST: 15 U/L (ref 15–41)
Albumin: 4.6 g/dL (ref 3.5–5.0)
Alkaline Phosphatase: 53 U/L (ref 38–126)
Anion gap: 11 (ref 5–15)
BUN: 8 mg/dL (ref 6–20)
CO2: 24 mmol/L (ref 22–32)
Calcium: 9.9 mg/dL (ref 8.9–10.3)
Chloride: 105 mmol/L (ref 98–111)
Creatinine, Ser: 0.79 mg/dL (ref 0.44–1.00)
GFR, Estimated: >60 mL/min (ref >60)
Glucose, Bld: 97 mg/dL (ref 70–99)
Potassium: 4 mmol/L (ref 3.5–5.1)
Sodium: 140 mmol/L (ref 135–145)
Total Bilirubin: 0.4 mg/dL (ref 0.3–1.2)
Total Protein: 7.1 g/dL (ref 6.5–8.1)

## 2022-04-24 LAB — URINALYSIS, ROUTINE W REFLEX MICROSCOPIC
Glucose, UA: NEGATIVE mg/dL
Hgb urine dipstick: NEGATIVE
Ketones, ur: NEGATIVE mg/dL
Nitrite: POSITIVE — AB
Protein, ur: 30 mg/dL — AB
Specific Gravity, Urine: 1.021 (ref 1.005–1.030)
pH: 7 (ref 5.0–8.0)

## 2022-04-24 LAB — LIPASE, BLOOD: Lipase: 16 U/L (ref 11–51)

## 2022-04-24 LAB — CBC
HCT: 39.1 % (ref 36.0–46.0)
Hemoglobin: 13 g/dL (ref 12.0–15.0)
MCH: 29.1 pg (ref 26.0–34.0)
MCHC: 33.2 g/dL (ref 30.0–36.0)
MCV: 87.5 fL (ref 80.0–100.0)
Platelets: 229 10*3/uL (ref 150–400)
RBC: 4.47 MIL/uL (ref 3.87–5.11)
RDW: 12.2 % (ref 11.5–15.5)
WBC: 10.1 10*3/uL (ref 4.0–10.5)
nRBC: 0 % (ref 0.0–0.2)

## 2022-04-24 LAB — PREGNANCY, URINE: Preg Test, Ur: NEGATIVE

## 2022-04-24 MED ORDER — NITROFURANTOIN MONOHYD MACRO 100 MG PO CAPS: 100.0000 mg | ORAL_CAPSULE | Freq: Once | ORAL | Status: DC

## 2022-04-24 MED ORDER — SODIUM CHLORIDE 0.9 % IV BOLUS
1000.0000 mL | Freq: Once | INTRAVENOUS | Status: AC
  Administered 2022-04-24: 1000 mL via INTRAVENOUS

## 2022-04-24 MED ORDER — IOHEXOL 300 MG/ML  SOLN: 100.0000 mL | Freq: Once | INTRAMUSCULAR | Status: DC | PRN

## 2022-04-24 MED ORDER — NITROFURANTOIN MONOHYD MACRO 100 MG PO CAPS: 100.0000 mg | ORAL_CAPSULE | Freq: Two times a day (BID) | ORAL | 0 refills | Status: DC

## 2022-04-24 NOTE — ED Provider Notes (Cosign Needed Addendum)
[facility] EMERGENCY DEPT Provider Note   CSN: [id number] Arrival date & time: 04/24/22  1129     History  Chief Complaint  Patient presents with   Abdominal Pain    April Harding is a 21 y.o. female.   Abdominal Pain Associated symptoms: dysuria and nausea   Associated symptoms: no chest pain, no chills, no cough, no fever, no hematuria, no shortness of breath, no sore throat, no vaginal bleeding, no vaginal discharge and no vomiting    21 year old female presents emergency department with complaints of abdominal pain.  Patient states that symptoms have been present for approximately 1 week.  Symptoms began insidiously.  She notes symptoms of dysuria.  Notes that abdominal pain has been worse with eating.  States that pain began around her bellybutton and is now in her lower abdomen.  She also notes nausea but denies vomiting.  Denies fever, chills, chest pain, shortness of breath, hematochezia, melena, vaginal symptoms.  She has tried Azo for her urinary symptoms with some relief.  Last menstrual period was approximately 31 days ago.  She states her menstrual cycles are regular and she is currently not using form of contraceptive.  She is not concerned for STD/STI at this moment.  Denies prior abdominal surgery.  Home Medications Prior to Admission medications   Medication Sig Start Date End Date Taking? Authorizing Provider  nitrofurantoin, macrocrystal-monohydrate, (MACROBID) 100 MG capsule Take 1 capsule (100 mg total) by mouth 2 (two) times daily. 04/24/22  Yes Sherian Maroon A, PA  albuterol (PROAIR HFA) 108 (90 Base) MCG/ACT inhaler Inhale 2 puffs into the lungs every 6 (six) hours as needed. 03/25/20   [provider]  ALPRAZolam Prudy Feeler) 0.25 MG tablet Take 0.25 mg by mouth at bedtime. 02/14/21   [provider]  DULoxetine (CYMBALTA) 60 MG capsule Take 1 capsule (60 mg total) by mouth daily. 03/12/21   Oneta Rack, NP  hydrOXYzine  (ATARAX/VISTARIL) 25 MG tablet Take 1 tablet (25 mg total) by mouth 3 (three) times daily as needed for anxiety. 03/11/21   Oneta Rack, NP  sertraline (ZOLOFT) 25 MG tablet Take 25 mg by mouth daily. 03/14/21   [provider]  traZODone (DESYREL) 50 MG tablet Take 1 tablet (50 mg total) by mouth at bedtime as needed (give for sleep if PRN alprazolam is not effective). 03/11/21   Oneta Rack, NP      Allergies    Bee venom, Reglan [metoclopramide], and Caine-1 [lidocaine]    Review of Systems   Review of Systems  Constitutional:  Negative for chills and fever.  HENT:  Negative for ear pain and sore throat.   Eyes:  Negative for pain and visual disturbance.  Respiratory:  Negative for cough and shortness of breath.   Cardiovascular:  Negative for chest pain and palpitations.  Gastrointestinal:  Positive for abdominal pain and nausea. Negative for blood in stool and vomiting.  Genitourinary:  Positive for dysuria. Negative for hematuria, vaginal bleeding, vaginal discharge and vaginal pain.  Musculoskeletal:  Negative for arthralgias and back pain.  Skin:  Negative for color change and rash.  Neurological:  Negative for seizures and syncope.  All other systems reviewed and are negative.   Physical Exam Updated Vital Signs BP 118/81 (BP Location: Right Arm)   Pulse 78   Temp 98.7 F (37.1 C)   Resp 16   LMP 03/25/2022   SpO2 99%  Physical Exam Vitals and nursing note reviewed.  Constitutional:  General: She is not in acute distress.    Appearance: She is well-developed.  HENT:     Head: Normocephalic and atraumatic.  Eyes:     Conjunctiva/sclera: Conjunctivae normal.  Cardiovascular:     Rate and Rhythm: Normal rate and regular rhythm.     Heart sounds: No murmur heard. Pulmonary:     Effort: Pulmonary effort is normal. No respiratory distress.     Breath sounds: Normal breath sounds.  Abdominal:     General: Abdomen is flat.     Palpations: Abdomen is  soft.     Tenderness: There is abdominal tenderness in the right lower quadrant and suprapubic area. There is no right CVA tenderness, left CVA tenderness or guarding. Positive signs include Murphy's sign. Negative signs include McBurney's sign.     Comments: Patient has right lower quadrant tenderness as well as suprapubic tenderness.  Rovsing sign positive.  No overlying skin abnormalities noted.  Musculoskeletal:        General: No swelling.     Cervical back: Neck supple.  Skin:    General: Skin is warm and dry.     Capillary Refill: Capillary refill takes less than 2 seconds.  Neurological:     Mental Status: She is alert.  Psychiatric:        Mood and Affect: Mood normal.     ED Results / Procedures / Treatments   Labs (all labs ordered are listed, but only abnormal results are displayed) Labs Reviewed  URINALYSIS, ROUTINE W REFLEX MICROSCOPIC - Abnormal; Notable for the following components:      Result Value   Color, Urine ORANGE (*)    APPearance HAZY (*)    Bilirubin Urine LARGE (*)    Protein, ur 30 (*)    Nitrite POSITIVE (*)    Leukocytes,Ua MODERATE (*)    Bacteria, UA RARE (*)    Non Squamous Epithelial 0-5 (*)    All other components within normal limits  LIPASE, BLOOD  COMPREHENSIVE METABOLIC PANEL  CBC  PREGNANCY, URINE    EKG None  Radiology No results found.  Procedures Procedures    Medications Ordered in ED Medications  iohexol (OMNIPAQUE) 300 MG/ML solution 100 mL (has no administration in time range)  sodium chloride 0.9 % bolus 1,000 mL (1,000 mLs Intravenous New Bag/Given 04/24/22 1341)    ED Course/ Medical Decision Making/ A&P                           Medical Decision Making Amount and/or Complexity of Data Reviewed Labs: ordered. Radiology: ordered.  Risk Prescription drug management.   This patient presents to the ED for concern of abdominal pain, this involves an extensive number of treatment options, and is a complaint  that carries with it a high risk of complications and morbidity.  The differential diagnosis includes The causes of generalized abdominal pain include but are not limited to AAA, mesenteric ischemia, appendicitis, diverticulitis, DKA, gastritis, gastroenteritis, AMI, nephrolithiasis, pancreatitis, peritonitis, adrenal insufficiency,lead poisoning, iron toxicity, intestinal ischemia, constipation, UTI,SBO/LBO, splenic rupture, biliary disease, IBD, IBS, PUD, or hepatitis. Ectopic pregnancy, ovarian torsion, PID.  Co morbidities that complicate the patient evaluation  Generalized anxiety disorder, major depressive disorder, PTSD   Lab Tests:  I Ordered, and personally interpreted labs.  The pertinent results include: UA significant for rare bacteria, 11-20 WBC, moderate leukocyte, positive nitrite, 30 protein, large bilirubin.   Imaging Studies ordered:  I ordered imaging studies including  CT abdomen pelvis with contrast In transit to CT, patient declined CT.     Cardiac Monitoring: / EKG:  The patient was maintained on a cardiac monitor.  I personally viewed and interpreted the cardiac monitored which showed an underlying rhythm of: Sinus rhythm   Consultations Obtained:  N/a   Problem List / ED Course / Critical interventions / Medication management  Abdominal pain I ordered medication including 1 L of normal saline but patient declined. Reevaluation of the patient after these medicines showed that the patient stayed the same I have reviewed the patients home medicines and have made adjustments as needed   Social Determinants of Health:  Denies tobacco, alcohol, illicit drug use.   Test / Admission - Considered:  Abdominal pain Vitals signs within normal range and stable throughout visit. Laboratory/imaging studies significant for: Urinary tract infection as mentioned above.  Patient declined CT abdomen pelvis for further assessment of potential appendix versus ovarian  pathology of right lower quadrant tenderness.  Patient deemed to have capacity to make medical decisions.  Risks discussed at length with patient and she still elected to go home and gauge response from antibiotic therapy for urinary tract infection.  Patient elected to leave AGAINST MEDICAL ADVICE. Worrisome signs and symptoms were discussed with the patient, and the patient acknowledged understanding to return to the ED if noticed. Patient was stable upon discharge.          Final Clinical Impression(s) / ED Diagnoses Final diagnoses:  Acute cystitis without hematuria  Lower abdominal pain    Rx / DC Orders ED Discharge Orders          Ordered    nitrofurantoin, macrocrystal-monohydrate, (MACROBID) 100 MG capsule  2 times daily        04/24/22 1357              Peter Garter, [place] 04/24/22 1400    Peter Garter, [place] 04/24/22 1401    Margarita Grizzle, MD 04/25/22 [phone]

## 2022-04-24 NOTE — ED Notes (Signed)
Per CT tech patient refused imaging study and asked for the IV to be removed, EDP notified.

## 2022-04-24 NOTE — ED Notes (Signed)
Patient transported to CT 

## 2022-04-24 NOTE — ED Triage Notes (Signed)
Patient arrives with complaint of lower abdominal pain x1 week. Patient reports previous kidney infections and UTI's. Took Azo with no relief.   Rates pain a 5/10.

## 2022-04-24 NOTE — Discharge Instructions (Addendum)
Note your work-up today was significant for urinary tract infection.  I will send antibiotics into your local pharmacy to take twice a day for the next 5 days.  Given that you refused imaging today, please do not hesitate to return to the emergency department if your symptoms do not get better with antibiotics or if they get worse.  Please not hesitate to return to the emergency department for worrisome signs symptoms we discussed, parent.

## 2022-09-08 ENCOUNTER — Other Ambulatory Visit: Payer: Self-pay

## 2022-09-08 ENCOUNTER — Ambulatory Visit
Admission: EM | Admit: 2022-09-08 | Discharge: 2022-09-08 | Disposition: A | Discharge status: Home / Self Care | Payer: 59

## 2022-09-08 DIAGNOSIS — J309 Allergic rhinitis, unspecified: Secondary | ICD-10-CM

## 2022-09-08 NOTE — Discharge Instructions (Addendum)
Advised patient if symptoms worsen and/or unresolved please follow-up with PCP, here for go to nearest ED for further evaluation. Advised patient may take OTC Allegra for postnasal drainage/drip.

## 2022-09-08 NOTE — ED Triage Notes (Addendum)
Pt presents to Urgent Care with c/o nasal congestion x 1 week and cough w/ mid sternal chest heaviness x 2 days. States she has intermittent sob. Negative COVID test. Bilateral ear fullness and pain also reported.

## 2022-09-08 NOTE — ED Provider Notes (Signed)
[facility]    CSN: [id number] Arrival date & time: 09/08/22  1547      History   Chief Complaint Chief Complaint  Patient presents with   Nasal Congestion   Otalgia   Chest Pain    HPI April Harding is a 21 y.o. female.   HPI 21 year old female presents with nasal congestion and cough and midsternal chest heaviness for 1 week.  Reports intermittent shortness of breath.  Recent negative COVID-19 test.  Patient also reports bilateral ear fullness. PMH significant for GAD, depression and migraine.  Past Medical History:  Diagnosis Date   Anxiety    Concussion    multiple   Depression    GAD (generalized anxiety disorder)    Head injury    w/LOC   MDD (major depressive disorder)    Migraine    PTSD (post-traumatic stress disorder)    d/t divorce in childhood    Patient Active Problem List   Diagnosis Date Noted   Severe recurrent major depression without psychotic features (HCC) 03/09/2021   Suicidal ideation 03/08/2021   GAD (generalized anxiety disorder) 03/08/2021   Panic disorder 03/08/2021   MDD (major depressive disorder), recurrent severe, without psychosis (HCC) 03/08/2021   Concussion with loss of consciousness 02/20/2013   Anxiety, generalized 03/26/2012   Family disruption due to divorce 03/26/2012    Past Surgical History:  Procedure Laterality Date   TONSILLECTOMY AND ADENOIDECTOMY     TYMPANOSTOMY TUBE PLACEMENT     URETHRAL DILATION      OB History   No obstetric history on file.      Home Medications    Prior to Admission medications   Medication Sig Start Date End Date Taking? Authorizing Provider  albuterol (PROAIR HFA) 108 (90 Base) MCG/ACT inhaler Inhale 2 puffs into the lungs every 6 (six) hours as needed. 03/25/20   [provider]  ALPRAZolam Prudy Feeler) 0.25 MG tablet Take 0.25 mg by mouth at bedtime. 02/14/21   [provider]  DULoxetine (CYMBALTA) 60 MG capsule Take 1 capsule (60 mg total) by mouth  daily. 03/12/21   Oneta Rack, NP  hydrOXYzine (ATARAX/VISTARIL) 25 MG tablet Take 1 tablet (25 mg total) by mouth 3 (three) times daily as needed for anxiety. 03/11/21   Oneta Rack, NP  nitrofurantoin, macrocrystal-monohydrate, (MACROBID) 100 MG capsule Take 1 capsule (100 mg total) by mouth 2 (two) times daily. 04/24/22   Peter Garter, PA  sertraline (ZOLOFT) 25 MG tablet Take 25 mg by mouth daily. 03/14/21   [provider]  traZODone (DESYREL) 50 MG tablet Take 1 tablet (50 mg total) by mouth at bedtime as needed (give for sleep if PRN alprazolam is not effective). 03/11/21   Oneta Rack, NP    Family History Family History  Problem Relation Age of Onset   Atrial fibrillation Father    Sudden death Neg Hx    Hyperlipidemia Neg Hx    Hypertension Neg Hx    Heart attack Neg Hx    Diabetes Neg Hx     Social History Social History   Tobacco Use   Smoking status: Never   Smokeless tobacco: Never  Substance Use Topics   Alcohol use: No   Drug use: No     Allergies   Bee venom, Reglan [metoclopramide], and Caine-1 [lidocaine]   Review of Systems Review of Systems  HENT:  Positive for congestion, postnasal drip and rhinorrhea.   All other systems reviewed and are negative.  Physical Exam Triage Vital Signs ED Triage Vitals  Enc Vitals Group     BP 09/08/22 1558 115/78     Pulse Rate 09/08/22 1558 81     Resp 09/08/22 1558 20     Temp 09/08/22 1558 98.7 F (37.1 C)     Temp Source 09/08/22 1558 Oral     SpO2 09/08/22 1558 98 %     Weight 09/08/22 1555 163 lb (73.9 kg)     Height 09/08/22 1555 5\' 8"  (1.727 m)     Head Circumference --      Peak Flow --      Pain Score 09/08/22 1555 4     Pain Loc --      Pain Edu? --      Excl. in GC? --    No data found.  Updated Vital Signs BP 115/78 (BP Location: Left Arm)   Pulse 81   Temp 98.7 F (37.1 C) (Oral)   Resp 20   Ht 5\' 8"  (1.727 m)   Wt 163 lb (73.9 kg)   LMP 09/05/2022  (Approximate)   SpO2 98%   BMI 24.78 kg/m   Physical Exam Vitals and nursing note reviewed.  Constitutional:      Appearance: Normal appearance. She is normal weight.  HENT:     Head: Normocephalic and atraumatic.     Right Ear: Tympanic membrane, ear canal and external ear normal.     Left Ear: Tympanic membrane, ear canal and external ear normal.     Mouth/Throat:     Mouth: Mucous membranes are moist.     Pharynx: Oropharynx is clear.     Comments: Mild clear drainage of posterior oropharynx noted Eyes:     Extraocular Movements: Extraocular movements intact.     Conjunctiva/sclera: Conjunctivae normal.     Pupils: Pupils are equal, round, and reactive to light.  Cardiovascular:     Rate and Rhythm: Normal rate and regular rhythm.     Pulses: Normal pulses.     Heart sounds: Normal heart sounds. No murmur heard. Pulmonary:     Effort: Pulmonary effort is normal.     Breath sounds: Normal breath sounds. No wheezing, rhonchi or rales.  Musculoskeletal:        General: Normal range of motion.     Cervical back: Normal range of motion and neck supple.  Skin:    General: Skin is warm and dry.  Neurological:     General: No focal deficit present.     Mental Status: She is alert and oriented to person, place, and time. Mental status is at baseline.      UC Treatments / Results  Labs (all labs ordered are listed, but only abnormal results are displayed) Labs Reviewed - No data to display  EKG   Radiology No results found.  Procedures Procedures (including critical care time)  Medications Ordered in UC Medications - No data to display  Initial Impression / Assessment and Plan / UC Course  I have reviewed the triage vital signs and the nursing notes.  Pertinent labs & imaging results that were available during my care of the patient were reviewed by me and considered in my medical decision making (see chart for details).     MDM: 1.  Allergic rhinitis,  unspecified seasonality, unspecified trigger-advised OTC Allegra. Advised patient if symptoms worsen and/or unresolved please follow-up with PCP, here for go to nearest ED for further evaluation. Advised patient may take OTC Allegra for  postnasal drainage/drip.  Final Clinical Impressions(s) / UC Diagnoses   Final diagnoses:  Allergic rhinitis, unspecified seasonality, unspecified trigger     Discharge Instructions      Advised patient if symptoms worsen and/or unresolved please follow-up with PCP, here for go to nearest ED for further evaluation. Advised patient may take OTC Allegra for postnasal drainage/drip.    ED Prescriptions   None    PDMP not reviewed this encounter.   Trevor Iha, FNP 09/08/22 [phone]

## 2022-10-04 ENCOUNTER — Emergency Department (HOSPITAL_BASED_OUTPATIENT_CLINIC_OR_DEPARTMENT_OTHER)
Admission: EM | Admit: 2022-10-04 | Discharge: 2022-10-04 | Disposition: A | Discharge status: Home / Self Care | Payer: 59 | Attending: Emergency Medicine | Admitting: Emergency Medicine

## 2022-10-04 ENCOUNTER — Encounter (HOSPITAL_BASED_OUTPATIENT_CLINIC_OR_DEPARTMENT_OTHER): Payer: Self-pay

## 2022-10-04 ENCOUNTER — Emergency Department (HOSPITAL_BASED_OUTPATIENT_CLINIC_OR_DEPARTMENT_OTHER): Payer: 59

## 2022-10-04 ENCOUNTER — Other Ambulatory Visit: Payer: Self-pay

## 2022-10-04 DIAGNOSIS — S0990XA Unspecified injury of head, initial encounter: Secondary | ICD-10-CM | POA: Diagnosis present

## 2022-10-04 DIAGNOSIS — R6884 Jaw pain: Secondary | ICD-10-CM | POA: Diagnosis not present

## 2022-10-04 DIAGNOSIS — M542 Cervicalgia: Secondary | ICD-10-CM | POA: Diagnosis not present

## 2022-10-04 LAB — URINALYSIS, ROUTINE W REFLEX MICROSCOPIC
Bilirubin Urine: NEGATIVE
Glucose, UA: NEGATIVE mg/dL
Ketones, ur: NEGATIVE mg/dL
Leukocytes,Ua: NEGATIVE
Nitrite: NEGATIVE
Protein, ur: NEGATIVE mg/dL
Specific Gravity, Urine: 1.008 (ref 1.005–1.030)
pH: 7 (ref 5.0–8.0)

## 2022-10-04 LAB — PREGNANCY, URINE: Preg Test, Ur: NEGATIVE

## 2022-10-04 MED ORDER — IBUPROFEN 600 MG PO TABS: 600.0000 mg | ORAL_TABLET | Freq: Three times a day (TID) | ORAL | 0 refills | Status: AC | PRN
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
## 2022-12-10 ENCOUNTER — Emergency Department (HOSPITAL_BASED_OUTPATIENT_CLINIC_OR_DEPARTMENT_OTHER)
Admission: EM | Admit: 2022-12-10 | Discharge: 2022-12-11 | Disposition: A | Discharge status: Home / Self Care | Payer: 59 | Attending: Emergency Medicine | Admitting: Emergency Medicine

## 2022-12-10 ENCOUNTER — Encounter (HOSPITAL_BASED_OUTPATIENT_CLINIC_OR_DEPARTMENT_OTHER): Payer: Self-pay

## 2022-12-10 ENCOUNTER — Other Ambulatory Visit: Payer: Self-pay

## 2022-12-10 DIAGNOSIS — R519 Headache, unspecified: Secondary | ICD-10-CM | POA: Insufficient documentation

## 2022-12-10 MED ORDER — ACETAMINOPHEN 500 MG PO TABS
1000.0000 mg | ORAL_TABLET | Freq: Once | ORAL | Status: AC
  Administered 2022-12-11: 1000 mg via ORAL
  Filled 2022-12-10: qty 2

## 2022-12-10 MED ORDER — KETOROLAC TROMETHAMINE 15 MG/ML IJ SOLN
15.0000 mg | Freq: Once | INTRAMUSCULAR | Status: DC
  Filled 2022-12-10: qty 1

## 2022-12-10 MED ORDER — DIPHENHYDRAMINE HCL 50 MG/ML IJ SOLN
25.0000 mg | Freq: Once | INTRAMUSCULAR | Status: AC
  Administered 2022-12-11: 25 mg via INTRAVENOUS
  Filled 2022-12-10: qty 1

## 2022-12-10 MED ORDER — HALOPERIDOL LACTATE 5 MG/ML IJ SOLN
2.0000 mg | Freq: Once | INTRAMUSCULAR | Status: DC
  Filled 2022-12-10: qty 1

## 2022-12-10 MED ORDER — SODIUM CHLORIDE 0.9 % IV BOLUS
1000.0000 mL | Freq: Once | INTRAVENOUS | Status: AC
  Administered 2022-12-11: 1000 mL via INTRAVENOUS

## 2022-12-10 MED ORDER — HALOPERIDOL LACTATE 5 MG/ML IJ SOLN
1.0000 mg | Freq: Once | INTRAMUSCULAR | Status: AC
  Administered 2022-12-11: 1 mg via INTRAVENOUS

## 2022-12-10 NOTE — ED Triage Notes (Signed)
Pt c/o migraine x3-4 days, associated photosensitivity, nausea/ inner ear pressure & occipital HA pressure. States she's felt lightheaded & off-balance since. Hx of migraines, "this has been the worst one."   Pt states that "the whole migraine cocktail does not work for me," states that [place] makes her anxious.

## 2022-12-11 MED ORDER — AMOXICILLIN 500 MG PO CAPS: 500.0000 mg | ORAL_CAPSULE | Freq: Three times a day (TID) | ORAL | 0 refills | Status: AC
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
## 2022-12-11 NOTE — ED Notes (Signed)
Patient became anxious after benadryl then relaxed with v/s stable. Calm. After haldol 5-7 min passed and patient became hyper anxious screaming and demanding her IV be removed and demanded to mother that she was going home.  IV dc'd and patient briskly left ER. Mother still in department requesting antbx for patient's OM previously discussed with ER physician.

## 2022-12-11 NOTE — Discharge Instructions (Signed)
You were evaluated in the Emergency Department and after careful evaluation, we did not find any emergent condition requiring admission or further testing in the hospital.  Your exam/testing today was overall reassuring.  Recommend follow-up with a neurologist to discuss your headaches.  Please return to the Emergency Department if you experience any worsening of your condition.  Thank you for allowing [place] to be a part of your care.

## 2024-04-09 ENCOUNTER — Emergency Department: Payer: Worker's Compensation

## 2024-04-09 ENCOUNTER — Other Ambulatory Visit: Payer: Self-pay

## 2024-04-09 ENCOUNTER — Emergency Department

## 2024-04-09 ENCOUNTER — Emergency Department
Admission: EM | Admit: 2024-04-09 | Discharge: 2024-04-10 | Disposition: A | Discharge status: Home / Self Care | Payer: Worker's Compensation | Attending: Emergency Medicine | Admitting: Emergency Medicine

## 2024-04-09 DIAGNOSIS — W228XXA Striking against or struck by other objects, initial encounter: Secondary | ICD-10-CM | POA: Insufficient documentation

## 2024-04-09 DIAGNOSIS — Y990 Civilian activity done for income or pay: Secondary | ICD-10-CM | POA: Diagnosis not present

## 2024-04-09 DIAGNOSIS — S0990XA Unspecified injury of head, initial encounter: Secondary | ICD-10-CM
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
## 2024-04-09 NOTE — ED Provider Notes (Signed)
 [facility] Provider Note    Event Date/Time   First MD Initiated Contact with Patient 04/09/24 2312     (approximate)   History   Head Injury   HPI  April Harding is a 23 y.o. female with history of multiple prior concussions, depression, anxiety who presents to the emergency department complaints of left-sided head injury that occurred while at work just prior to arrival.  She reports an approximately 600 pound box hit her in the left side of the head.  She states she was knocked to the ground and thinks she lost consciousness for a few seconds.  No neck or back pain.  No numbness, tingling or weakness.  No chest or abdominal injury.  No nausea or vomiting.  No vision or speech changes.  She states she took Tylenol  prior to arrival.   History provided by patient, friend.    Past Medical History:  Diagnosis Date   Anxiety    Concussion    multiple   Depression    GAD (generalized anxiety disorder)    Head injury    w/LOC   MDD (major depressive disorder)    Migraine    PTSD (post-traumatic stress disorder)    d/t divorce in childhood    Past Surgical History:  Procedure Laterality Date   TONSILLECTOMY AND ADENOIDECTOMY     TYMPANOSTOMY TUBE PLACEMENT     URETHRAL DILATION      MEDICATIONS:  Prior to Admission medications   Medication Sig Start Date End Date Taking? Authorizing Provider  albuterol (PROAIR HFA) 108 (90 Base) MCG/ACT inhaler Inhale 2 puffs into the lungs every 6 (six) hours as needed. 03/25/20   [provider]  ALPRAZolam  (XANAX ) 0.25 MG tablet Take 0.25 mg by mouth at bedtime. 02/14/21   [provider]  DULoxetine  (CYMBALTA ) 60 MG capsule Take 1 capsule (60 mg total) by mouth daily. 03/12/21   Levester Reagin, NP  hydrOXYzine  (ATARAX /VISTARIL ) 25 MG tablet Take 1 tablet (25 mg total) by mouth 3 (three) times daily as needed for anxiety. 03/11/21   Levester Reagin, NP  ibuprofen  (ADVIL ) 600 MG tablet Take 1  tablet (600 mg total) by mouth every 8 (eight) hours as needed for moderate pain. 10/04/22   Rancour, Mara Seminole, MD  nitrofurantoin , macrocrystal-monohydrate, (MACROBID ) 100 MG capsule Take 1 capsule (100 mg total) by mouth 2 (two) times daily. 04/24/22   Aitkin Butter, PA  sertraline (ZOLOFT) 25 MG tablet Take 25 mg by mouth daily. 03/14/21   [provider]  traZODone  (DESYREL ) 50 MG tablet Take 1 tablet (50 mg total) by mouth at bedtime as needed (give for sleep if PRN alprazolam  is not effective). 03/11/21   Levester Reagin, NP    Physical Exam   Triage Vital Signs: ED Triage Vitals  Encounter Vitals Group     BP 04/09/24 2218 116/75     Girls Systolic BP Percentile --      Girls Diastolic BP Percentile --      Boys Systolic BP Percentile --      Boys Diastolic BP Percentile --      Pulse Rate 04/09/24 2218 63     Resp 04/09/24 2218 20     Temp 04/09/24 2218 99.4 F (37.4 C)     Temp Source 04/09/24 2218 Oral     SpO2 04/09/24 2218 100 %     Weight 04/09/24 2218 160 lb (72.6 kg)     Height 04/09/24 2218  5' 8 (1.727 m)     Head Circumference --      Peak Flow --      Pain Score 04/09/24 2229 3     Pain Loc --      Pain Education --      Exclude from Growth Chart --     Most recent vital signs: Vitals:   04/09/24 2218 04/09/24 2357  BP: 116/75 122/73  Pulse: 63 63  Resp: 20 18  Temp: 99.4 F (37.4 C)   SpO2: 100% 100%     CONSTITUTIONAL: Alert, responds appropriately to questions. Well-appearing; well-nourished; GCS 15 HEAD: Normocephalic; tender to palpation over the left scalp without hematoma, deformity EYES: Conjunctivae clear, extraocular movements appear intact ENT: normal nose; no rhinorrhea; moist mucous membranes NECK: Supple, no midline spinal tenderness, step-off or deformity CARD: RRR; S1 and S2 appreciated; no murmurs, no clicks, no rubs, no gallops RESP: Normal chest excursion without splinting or tachypnea; breath sounds clear and equal  bilaterally; no wheezes, no rhonchi, no rales; no hypoxia or respiratory distress CHEST:  chest wall stable, no crepitus or ecchymosis or deformity, nontender to palpation; no flail chest ABD/GI: Non-distended; soft, non-tender, no rebound, no guarding BACK:  The back appears normal; no midline spinal tenderness, step-off or deformity EXT: Normal ROM in all joints; no obvious deformity noted SKIN: Normal color for age and race; warm NEURO: No facial asymmetry, normal speech, moving all extremities equally, normal sensation diffusely, ambulates with normal gait  ED Results / Procedures / Treatments   LABS: (all labs ordered are listed, but only abnormal results are displayed) Labs Reviewed - No data to display   EKG:  EKG Interpretation Date/Time:    Ventricular Rate:    PR Interval:    QRS Duration:    QT Interval:    QTC Calculation:   R Axis:      Text Interpretation:            RADIOLOGY: My personal review and interpretation of imaging: CT head unremarkable.  I have personally reviewed all radiology reports. CT HEAD WO CONTRAST ( ) Result Date: 04/09/2024 CLINICAL DATA:  Head trauma, moderate-severe EXAM: CT HEAD WITHOUT CONTRAST TECHNIQUE: Contiguous axial images were obtained from the base of the skull through the vertex without intravenous contrast. RADIATION DOSE REDUCTION: This exam was performed according to the departmental dose-optimization program which includes automated exposure control, adjustment of the mA and/or kV according to patient size and/or use of iterative reconstruction technique. COMPARISON:  CT head October 04, 2022 FINDINGS: Brain: No evidence of acute infarction, hemorrhage, hydrocephalus, extra-axial collection or mass lesion/mass effect. Vascular: No hyperdense vessel. Skull: No acute fracture. Sinuses/Orbits: Clear sinuses.  No acute orbital findings. Other: No mastoid effusions. IMPRESSION: No evidence of acute intracranial abnormality.  Electronically Signed   By: Stevenson Elbe M.D.   On: 04/09/2024 23:46     PROCEDURES:  Critical Care performed: No     Procedures    IMPRESSION / MDM / ASSESSMENT AND PLAN / ED COURSE  I reviewed the triage vital signs and the nursing notes.  Patient here after head injury.  History of multiple prior concussions per her report.  Neuro intact here.    DIFFERENTIAL DIAGNOSIS (includes but not limited to):   Head injury, contusion, concussion, skull fracture, intracranial hemorrhage  Patient's presentation is most consistent with acute presentation with potential threat to life or bodily function.  PLAN: Will obtain CT of the head given mechanism.  Patient declines anything  for pain.  No other sign of trauma on exam.  Neuro intact here.  Provided with ice pack here.   MEDICATIONS GIVEN IN ED: Medications - No data to display   ED COURSE: CT head reviewed and interpreted by myself and the radiologist and is unremarkable.  Discussed head injury return precautions, supportive care instructions.  Patient reports she does have primary care for follow-up but is also asking for information for [facility] in [place] she lives in that area and would like to establish care with their practice.  Recommended Tylenol , Motrin  as needed for pain.  Will provide with work note for 3 days to allow for brain rest given history of multiple prior concussions with mild concussive symptoms currently.   At this time, I do not feel there is any life-threatening condition present. I reviewed all nursing notes, vitals, pertinent previous records.  All lab and urine results, EKGs, imaging ordered have been independently reviewed and interpreted by myself.  I reviewed all available radiology reports from any imaging ordered this visit.  Based on my assessment, I feel the patient is safe to be discharged home without further emergent workup and can continue workup as an outpatient as  needed. Discussed all findings, treatment plan as well as usual and customary return precautions.  They verbalize understanding and are comfortable with this plan.  Outpatient follow-up has been provided as needed.  All questions have been answered.    CONSULTS:  none   OUTSIDE RECORDS REVIEWED: Reviewed last cardiology note on 05/25/2023.       FINAL CLINICAL IMPRESSION(S) / ED DIAGNOSES   Final diagnoses:  Injury of head, initial encounter     Rx / DC Orders   ED Discharge Orders     None        Note:  This document was prepared using Dragon voice recognition software and may include unintentional dictation errors.   Tarnisha Kachmar, Clover Dao, DO 04/10/24 [phone]

## 2024-04-10 NOTE — ED Notes (Signed)
 Discharge instructions reviewed. Pt verbalized understanding. NADN. Pt ambulatory on discharge with steady gait.

## 2024-07-23 ENCOUNTER — Encounter: Payer: Self-pay | Admitting: Neurology

## 2024-09-28 ENCOUNTER — Ambulatory Visit
Admission: RE | Admit: 2024-09-28 | Discharge: 2024-09-28 | Disposition: A | Discharge status: Home / Self Care | Source: Ambulatory Visit | Attending: Family Medicine

## 2024-09-28 VITALS — BP 116/73 | HR 77 | Temp 98.4°F | Resp 18

## 2024-09-28 DIAGNOSIS — G43809 Other migraine, not intractable, without status migrainosus: Secondary | ICD-10-CM

## 2024-09-28 DIAGNOSIS — R110 Nausea: Secondary | ICD-10-CM

## 2024-09-28 DIAGNOSIS — R300 Dysuria: Secondary | ICD-10-CM

## 2024-09-28 DIAGNOSIS — N3000 Acute cystitis without hematuria: Secondary | ICD-10-CM

## 2024-09-28 DIAGNOSIS — R52 Pain, unspecified: Secondary | ICD-10-CM

## 2024-09-28 LAB — POCT URINE DIPSTICK
Bilirubin, UA: NEGATIVE
Blood, UA: NEGATIVE
Glucose, UA: 100 mg/dL — AB
Ketones, POC UA: NEGATIVE mg/dL
Leukocytes, UA: NEGATIVE
Nitrite, UA: POSITIVE — AB
POC PROTEIN,UA: NEGATIVE
Spec Grav, UA: 1.02 (ref 1.010–1.025)
Urobilinogen, UA: 0.2 U/dL
pH, UA: 7 (ref 5.0–8.0)

## 2024-09-28 LAB — POCT INFLUENZA A/B
Influenza A, POC: NEGATIVE
Influenza B, POC: NEGATIVE

## 2024-09-28 LAB — POCT URINE PREGNANCY: Preg Test, Ur: NEGATIVE

## 2024-09-28 MED ORDER — NITROFURANTOIN MONOHYD MACRO 100 MG PO CAPS: 100.0000 mg | ORAL_CAPSULE | Freq: Two times a day (BID) | ORAL | 0 refills | Status: DC

## 2024-09-28 MED ORDER — ONDANSETRON 4 MG PO TBDP: 4.0000 mg | ORAL_TABLET | Freq: Three times a day (TID) | ORAL | 0 refills | Status: AC | PRN

## 2024-09-28 MED ORDER — ONDANSETRON 4 MG PO TBDP
4.0000 mg | ORAL_TABLET | Freq: Once | ORAL | Status: AC
  Administered 2024-09-28: 4 mg via ORAL

## 2024-09-28 NOTE — ED Triage Notes (Signed)
 Pt present with c/o migraine x 5 days. Pt states she sees a neurologist for her migraines. States she has been feeling fatigued and off balance, ear pain. Pt states she has been urinating frequently and discomfort when voiding.

## 2024-09-28 NOTE — Discharge Instructions (Addendum)
 The clinic contact you with results of urine culture done today are positive.  Start Macrobid  twice daily for 5 days to treat your urinary tract infection.  You are given Zofran  in clinic for nausea and vomiting.  You may take this every 8 hours as needed.  Take your Maxalt for your migraine and follow-up with your neurologist as soon as you can.  Please go to the emergency room if you develop any worsening symptoms or you get no relief from your Maxalt.  Also follow-up with your PCP at your scheduled appointment in 2 days.  I hope you feel better soon!

## 2024-09-28 NOTE — ED Provider Notes (Signed)
 [facility]    CSN: [id number] Arrival date & time: 09/28/24  1657      History   Chief Complaint Chief Complaint  Patient presents with   Generalized Body Aches    Body aches, Possible UTI, Migraine (1 Week), stiff neck, stomach pain & back pain(lower) & stomach issues. - Entered by patient    HPI April Harding is a 23 y.o. female presents for headache and possible UTI.  Patient has a history of migraines and reports over the past 5 days she has had intermittent migrating migraine that she rates as a 6 out of 10 and denies worst headache of life.  It is associated with photophobia, nausea, dizziness, fatigue.  Denies any syncope, vomiting, visual changes.  Denies thunderclap headache.  No first-degree relative with SAH.  Does see neurology for her migraine and was recently prescribed Maxalt and Topamax.  Patient has not tried or taken these since they were prescribed as she was concerned it may interact with her Klonopin.  In addition she reports some lower abdominal pain with urinary frequency/discomfort.  Denies fevers, flank pain.  No vaginal discharge or STD concern.  She is also reporting some ear pain.  No URI symptoms/sore throat but does endorse generalized bodyaches.  Reports a negative home COVID test.  Has been taking Tylenol  for symptoms.  No other concerns at this time.  HPI  Past Medical History:  Diagnosis Date   Anxiety    Concussion    multiple   Depression    GAD (generalized anxiety disorder)    Head injury    w/LOC   MDD (major depressive disorder)    Migraine    PTSD (post-traumatic stress disorder)    d/t divorce in childhood    Patient Active Problem List   Diagnosis Date Noted   Severe recurrent major depression without psychotic features (HCC) 03/09/2021   Suicidal ideation 03/08/2021   GAD (generalized anxiety disorder) 03/08/2021   Panic disorder 03/08/2021   MDD (major depressive disorder), recurrent severe, without psychosis (HCC)  03/08/2021   Concussion with loss of consciousness 02/20/2013   Anxiety, generalized 03/26/2012   Family disruption due to divorce 03/26/2012    Past Surgical History:  Procedure Laterality Date   TONSILLECTOMY AND ADENOIDECTOMY     TYMPANOSTOMY TUBE PLACEMENT     URETHRAL DILATION      OB History   No obstetric history on file.      Home Medications    Prior to Admission medications   Medication Sig Start Date End Date Taking? Authorizing Provider  nitrofurantoin , macrocrystal-monohydrate, (MACROBID ) 100 MG capsule Take 1 capsule (100 mg total) by mouth 2 (two) times daily. 09/28/24  Yes Heavenleigh Petruzzi, Jodi R, NP  ondansetron  (ZOFRAN -ODT) 4 MG disintegrating tablet Take 1 tablet (4 mg total) by mouth every 8 (eight) hours as needed for nausea or vomiting. 09/28/24  Yes Haskell Rihn, Jodi R, NP  albuterol (PROAIR HFA) 108 (90 Base) MCG/ACT inhaler Inhale 2 puffs into the lungs every 6 (six) hours as needed. 03/25/20   [provider]  ALPRAZolam  (XANAX ) 0.25 MG tablet Take 0.25 mg by mouth at bedtime. 02/14/21   [provider]  DULoxetine  (CYMBALTA ) 60 MG capsule Take 1 capsule (60 mg total) by mouth daily. 03/12/21   Ezzard Staci SAILOR, NP  hydrOXYzine  (ATARAX /VISTARIL ) 25 MG tablet Take 1 tablet (25 mg total) by mouth 3 (three) times daily as needed for anxiety. 03/11/21   Ezzard Staci SAILOR, NP  ibuprofen  (ADVIL ) 600  MG tablet Take 1 tablet (600 mg total) by mouth every 8 (eight) hours as needed for moderate pain. 10/04/22   Rancour, Garnette, MD  sertraline (ZOLOFT) 25 MG tablet Take 25 mg by mouth daily. 03/14/21   [provider]  traZODone  (DESYREL ) 50 MG tablet Take 1 tablet (50 mg total) by mouth at bedtime as needed (give for sleep if PRN alprazolam  is not effective). 03/11/21   Ezzard Staci SAILOR, NP    Family History Family History  Problem Relation Age of Onset   Atrial fibrillation Father    Sudden death Neg Hx    Hyperlipidemia Neg Hx    Hypertension Neg Hx    Heart  attack Neg Hx    Diabetes Neg Hx     Social History Social History   Tobacco Use   Smoking status: Never   Smokeless tobacco: Never  Substance Use Topics   Alcohol use: No   Drug use: No     Allergies   Bee venom, Reglan  [metoclopramide ], and Caine-1 [lidocaine]   Review of Systems Review of Systems  Constitutional:  Positive for fatigue.  HENT:  Positive for ear pain.   Gastrointestinal:  Positive for nausea.  Genitourinary:  Positive for frequency.  Musculoskeletal:  Positive for myalgias.  Neurological:  Positive for headaches.     Physical Exam Triage Vital Signs ED Triage Vitals [09/28/24 1708]  Encounter Vitals Group     BP 116/73     Girls Systolic BP Percentile      Girls Diastolic BP Percentile      Boys Systolic BP Percentile      Boys Diastolic BP Percentile      Pulse Rate 77     Resp 18     Temp 98.4 F (36.9 C)     Temp src      SpO2 97 %     Weight      Height      Head Circumference      Peak Flow      Pain Score 5     Pain Loc      Pain Education      Exclude from Growth Chart    No data found.  Updated Vital Signs BP 116/73   Pulse 77   Temp 98.4 F (36.9 C)   Resp 18   LMP 09/16/2024 (Exact Date)   SpO2 97%   Visual Acuity Right Eye Distance:   Left Eye Distance:   Bilateral Distance:    Right Eye Near:   Left Eye Near:    Bilateral Near:     Physical Exam Vitals and nursing note reviewed.  Constitutional:      General: She is not in acute distress.    Appearance: Normal appearance. She is not ill-appearing.  HENT:     Head: Normocephalic and atraumatic.     Right Ear: Tympanic membrane and ear canal normal.     Left Ear: Tympanic membrane and ear canal normal.  Eyes:     Extraocular Movements: Extraocular movements intact.     Conjunctiva/sclera: Conjunctivae normal.     Pupils: Pupils are equal, round, and reactive to light.  Cardiovascular:     Rate and Rhythm: Normal rate.  Pulmonary:     Effort:  Pulmonary effort is normal.  Abdominal:     General: Bowel sounds are normal.     Palpations: Abdomen is soft.     Tenderness: There is no right CVA tenderness or left CVA  tenderness.  Musculoskeletal:     Cervical back: Normal range of motion and neck supple. No rigidity.  Skin:    General: Skin is warm and dry.  Neurological:     General: No focal deficit present.     Mental Status: She is alert and oriented to person, place, and time.     GCS: GCS eye subscore is 4. GCS verbal subscore is 5. GCS motor subscore is 6.     Cranial Nerves: No facial asymmetry.     Motor: No weakness.     Coordination: Romberg sign negative. Finger-Nose-Finger Test normal.     Gait: Tandem walk normal.  Psychiatric:        Mood and Affect: Mood normal.        Behavior: Behavior normal.      UC Treatments / Results  Labs (all labs ordered are listed, but only abnormal results are displayed) Labs Reviewed  POCT URINE DIPSTICK - Abnormal; Notable for the following components:      Result Value   Color, UA orange (*)    Glucose, UA =100 (*)    Nitrite, UA Positive (*)    All other components within normal limits  URINE CULTURE  POCT URINE PREGNANCY  POCT INFLUENZA A/B    EKG   Radiology No results found.  Procedures Procedures (including critical care time)  Medications Ordered in UC Medications  ondansetron  (ZOFRAN -ODT) disintegrating tablet 4 mg (4 mg Oral Given 09/28/24 1739)    Initial Impression / Assessment and Plan / UC Course  I have reviewed the triage vital signs and the nursing notes.  Pertinent labs & imaging results that were available during my care of the patient were reviewed by me and considered in my medical decision making (see chart for details).     Reviewed exam and symptoms with patient.  Negative flu testing.  Patient reports he is already had a negative home COVID test.  Urine with nitrates will send urine culture and treat with Macrobid  twice daily for 5  days.  Discussed migraine treatment available in clinic.  She states she has had a migraine cocktail in the ER including Toradol , Reglan  and another component and states she had a bad reaction including tachycardia but does not know which medication she reacted to.  She declines Toradol  injection in clinic.  She also declines Decadron  injection as she is not sure if she has had a reaction to that in the past or not.  I did give her Zofran  for nausea and vomiting and send an Rx to her pharmacy.  Advised I have no other treatments in clinic for her migraine and I advise she go home and take her Maxalt and follow-up with her neurologist.  She also has a PCP appointment in 2 days and she will follow-up then as well for recheck/further treatment.  Also advise she go to the ER if she develops any worsening symptoms.  Patient is in agreement with plan of care. Final Clinical Impressions(s) / UC Diagnoses   Final diagnoses:  Dysuria  Generalized body aches  Nausea  Other migraine without status migrainosus, not intractable  Acute cystitis without hematuria     Discharge Instructions      The clinic contact you with results of urine culture done today are positive.  Start Macrobid  twice daily for 5 days to treat your urinary tract infection.  You are given Zofran  in clinic for nausea and vomiting.  You may take this every 8 hours  as needed.  Take your Maxalt for your migraine and follow-up with your neurologist as soon as you can.  Please go to the emergency room if you develop any worsening symptoms or you get no relief from your Maxalt.  Also follow-up with your PCP at your scheduled appointment in 2 days.  I hope you feel better soon!     ED Prescriptions     Medication Sig Dispense Auth. Provider   ondansetron  (ZOFRAN -ODT) 4 MG disintegrating tablet Take 1 tablet (4 mg total) by mouth every 8 (eight) hours as needed for nausea or vomiting. 8 tablet Kaiyu Mirabal, Jodi R, NP   nitrofurantoin ,  macrocrystal-monohydrate, (MACROBID ) 100 MG capsule Take 1 capsule (100 mg total) by mouth 2 (two) times daily. 10 capsule Jereline Ticer, Jodi R, NP      PDMP not reviewed this encounter.   Loreda Myla SAUNDERS, NP 09/28/24 1759

## 2024-09-29 LAB — URINE CULTURE: Culture: NO GROWTH

## 2024-09-30 ENCOUNTER — Ambulatory Visit (HOSPITAL_COMMUNITY): Payer: Self-pay

## 2024-09-30 ENCOUNTER — Ambulatory Visit: Admitting: Sports Medicine

## 2024-10-05 ENCOUNTER — Encounter: Payer: Self-pay | Admitting: Sports Medicine

## 2024-10-05 ENCOUNTER — Ambulatory Visit: Admitting: Sports Medicine

## 2024-10-05 VITALS — BP 103/67 | HR 86 | Temp 97.8°F | Ht 68.0 in | Wt 167.8 lb

## 2024-10-05 DIAGNOSIS — Z131 Encounter for screening for diabetes mellitus: Secondary | ICD-10-CM

## 2024-10-05 DIAGNOSIS — R5383 Other fatigue: Secondary | ICD-10-CM

## 2024-10-05 DIAGNOSIS — Z124 Encounter for screening for malignant neoplasm of cervix: Secondary | ICD-10-CM

## 2024-10-05 DIAGNOSIS — M6281 Muscle weakness (generalized): Secondary | ICD-10-CM

## 2024-10-05 DIAGNOSIS — Z13220 Encounter for screening for lipoid disorders: Secondary | ICD-10-CM

## 2024-10-05 DIAGNOSIS — G43809 Other migraine, not intractable, without status migrainosus: Secondary | ICD-10-CM

## 2024-10-05 DIAGNOSIS — R55 Syncope and collapse: Secondary | ICD-10-CM

## 2024-10-05 DIAGNOSIS — R42 Dizziness and giddiness: Secondary | ICD-10-CM

## 2024-10-05 DIAGNOSIS — R194 Change in bowel habit: Secondary | ICD-10-CM

## 2024-10-05 NOTE — Patient Instructions (Signed)

## 2024-10-05 NOTE — Progress Notes (Unsigned)
 New Patient Office Visit  Patient ID: April Harding, Female   DOB: April 22, 2001 23 y.o. MRN: [id number] Subjective:     Discussed the use of AI scribe software for clinical note transcription with the patient, who gave verbal consent to proceed.  History of Present Illness   April Harding is a 23 year old female who presents with persistent fatigue, migraines, and gastrointestinal issues.  She has been experiencing persistent fatigue for the past two weeks, characterized by a lack of energy and feeling 'blah'. The fatigue is intermittent but has been more pronounced recently. No changes in sleep patterns or mood are noted, and she denies depression or lack of motivation.  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 She followed with cardiology who recommended fludrocortisone but she hasn't started yet.   She experiences daily migraines with severe bilateral head pain, blurry vision, ear ringing, and light sensitivity, which prevent her from getting out of bed. She has been using Tylenol  daily for the past three months but has not started a daily prophylactic medication. Nausea associated with the migraines affects her appetite.  She reports gastrointestinal symptoms, including stomach cramps and alternating constipation and diarrhea, particularly after consuming fried foods. Dietary modifications, such as avoiding pork and gluten, have provided little improvement. She saw GI in the past who recommended FODMAP diet.  She mentions random cramping in her hands and feet, which she finds concerning. She also reports feeling shaky and dizzy, particularly when skipping meals or consuming sugar, though these symptoms are inconsistent. She drinks approximately eight bottles of water daily and has a family history of diabetes.  Her current  medications include Klonopin for anxiety, taken at night to aid sleep, and Zoloft 50 mg daily. She has not started Topamax for migraine prophylaxis. She occasionally uses Tums or Nexium for heartburn and takes Maxalt as needed for migraine relief.  Outpatient Encounter Medications as of 10/05/2024  Medication Sig   ibuprofen  (ADVIL ) 600 MG tablet Take 1 tablet (600 mg total) by mouth every 8 (eight) hours as needed for moderate pain.   KLONOPIN 0.5 MG tablet    ondansetron  (ZOFRAN -ODT) 4 MG disintegrating tablet Take 1 tablet (4 mg total) by mouth every 8 (eight) hours as needed for nausea or vomiting.   sertraline (ZOLOFT) 50 MG tablet Take 50 mg by mouth at bedtime.   acetaminophen  (TYLENOL ) 325 MG tablet Take 650 mg by mouth. (Patient not taking: Reported on 10/05/2024)   ciclopirox (PENLAC) 8 % solution at bedtime. (Patient not taking: Reported on 10/05/2024)   clindamycin (CLEOCIN T) 1 % external solution Apply topically every morning. (Patient not taking: Reported on 10/05/2024)   clobetasol (TEMOVATE) 0.05 % external solution Apply topically 2 (two) times daily. (Patient not taking: Reported on 10/05/2024)   rizatriptan (MAXALT) 10 MG tablet 1 tab by mouth at the onset of a headache.  May repeat in 2 hours if needed.  Do not exceed more than two doses in a 24 hour period. (Patient not taking: Reported on 10/05/2024)   topiramate (TOPAMAX) 50 MG tablet 1 tab by mouth at bedtime for 1 week, then 1 tab twice a day   [DISCONTINUED] albuterol (PROAIR HFA) 108 (90 Base) MCG/ACT inhaler Inhale 2 puffs into the lungs every 6 (six) hours as needed.   [DISCONTINUED] ALPRAZolam  (XANAX ) 0.25 MG tablet Take  0.25 mg by mouth at bedtime.   [DISCONTINUED] DULoxetine  (CYMBALTA ) 60 MG capsule Take 1 capsule (60 mg total) by mouth daily.   [DISCONTINUED] hydrOXYzine  (ATARAX /VISTARIL ) 25 MG tablet Take 1 tablet (25 mg total) by mouth 3 (three) times daily as needed for anxiety.   [DISCONTINUED] nitrofurantoin ,  macrocrystal-monohydrate, (MACROBID ) 100 MG capsule Take 1 capsule (100 mg total) by mouth 2 (two) times daily.   [DISCONTINUED] sertraline (ZOLOFT) 25 MG tablet Take 25 mg by mouth daily.   [DISCONTINUED] traZODone  (DESYREL ) 50 MG tablet Take 1 tablet (50 mg total) by mouth at bedtime as needed (give for sleep if PRN alprazolam  is not effective).   [DISCONTINUED] Vilazodone HCl (VIIBRYD) 10 MG TABS TAKE 1 TABLET BY MOUTH EVERY DAY WITH FOOD FOR 30 DAYS   No facility-administered encounter medications on file as of 10/05/2024.    Past Medical History:  Diagnosis Date   Anxiety    Concussion    multiple   Depression    GAD (generalized anxiety disorder)    Head injury    w/LOC   MDD (major depressive disorder)    Migraine    PTSD (post-traumatic stress disorder)    d/t divorce in childhood    Past Surgical History:  Procedure Laterality Date   TONSILLECTOMY AND ADENOIDECTOMY     TYMPANOSTOMY TUBE PLACEMENT     URETHRAL DILATION      Family History  Problem Relation Age of Onset   Atrial fibrillation Father    Hypertension Father    Sudden death Neg Hx    Hyperlipidemia Neg Hx    Heart attack Neg Hx    Diabetes Neg Hx     Social History   Socioeconomic History   Marital status: Single    Spouse name: Not on file   Number of children: 0   Years of education: 14   Highest education level: Bachelor's degree (e.g., BA, AB, BS)  Occupational History    Comment: [employer]  Tobacco Use   Smoking status: Never   Smokeless tobacco: Never  Substance and Sexual Activity   Alcohol use: Never   Drug use: Never   Sexual activity: Never  Other Topics Concern   Not on file  Social History Narrative   living with mom   Social Drivers of Health   Tobacco Use: Low Risk (10/05/2024)   Patient History    Smoking Tobacco Use: Never    Smokeless Tobacco Use: Never    Passive Exposure: Not on file  Financial Resource Strain: Low Risk (09/30/2024)   Overall  Financial Resource Strain (CARDIA)    Difficulty of Paying Living Expenses: Not hard at all  Food Insecurity: No Food Insecurity (09/30/2024)   Epic    Worried About Programme Researcher, Broadcasting/film/video in the Last Year: Never true    Ran Out of Food in the Last Year: Never true  Transportation Needs: No Transportation Needs (09/30/2024)   Epic    Lack of Transportation (Medical): No    Lack of Transportation (Non-Medical): No  Physical Activity: Sufficiently Active (09/30/2024)   Exercise Vital Sign    Days of Exercise per Week: 7 days    Minutes of Exercise per Session: 40 min  Stress: Patient Declined (09/30/2024)   [employer] of Occupational Health - Occupational Stress Questionnaire    Feeling of Stress: Patient declined  Social Connections: Moderately Integrated (09/30/2024)   Social Connection and Isolation Panel    Frequency of Communication with Friends and Family: More than  three times a week    Frequency of Social Gatherings with Friends and Family: More than three times a week    Attends Religious Services: More than 4 times per year    Active Member of [employer] or Organizations: Yes    Attends Banker Meetings: More than 4 times per year    Marital Status: Never married  Intimate Partner Violence: Patient Declined (09/06/2024)   Received from [facility]   HITS    Over the last 12 months how often did your partner physically hurt you?: Patient declined    Over the last 12 months how often did your partner insult you or talk down to you?: Patient declined    Over the last 12 months how often did your partner threaten you with physical harm?: Patient declined    Over the last 12 months how often did your partner scream or curse at you?: Patient declined  Depression (PHQ2-9): Low Risk (10/05/2024)   Depression (PHQ2-9)    PHQ-2 Score: 3  Alcohol Screen: Not on file  Housing: Unknown (09/30/2024)   Epic    Unable to Pay for Housing in the Last Year: Patient declined     Number of Times Moved in the Last Year: Not on file    Homeless in the Last Year: No  Utilities: Patient Declined (09/06/2024)   Received from [facility]    In the past 12 months has the electric, gas, oil, or water company threatened to shut off services in your home?: Patient declined  Health Literacy: Not on file    Review of Systems  Constitutional:  Positive for malaise/fatigue. Negative for chills and fever.  HENT:  Negative for congestion and sore throat.   Eyes:  Negative for double vision.  Respiratory:  Negative for cough, sputum production and shortness of breath.   Cardiovascular:  Negative for chest pain, palpitations and leg swelling.  Gastrointestinal:  Negative for abdominal pain, heartburn and nausea.  Genitourinary:  Negative for dysuria, frequency and hematuria.  Musculoskeletal:  Positive for myalgias. Negative for falls.  Neurological:  Positive for dizziness. Negative for sensory change and focal weakness.     Objective:    BP 103/67   Pulse 86   Temp 97.8 F (36.6 C) (Oral)   Ht 5' 8 (1.727 m)   Wt 167 lb 12.8 oz (76.1 kg)   LMP 09/16/2024 (Exact Date)   SpO2 98%   BMI 25.51 kg/m   Physical Exam Constitutional:      Appearance: Normal appearance.  HENT:     Head: Normocephalic and atraumatic.     Left Ear: Tympanic membrane normal.     Mouth/Throat:     Pharynx: No posterior oropharyngeal erythema.  Eyes:     Comments: No nystagmus  Cardiovascular:     Rate and Rhythm: Normal rate and regular rhythm.  Pulmonary:     Effort: Pulmonary effort is normal. No respiratory distress.     Breath sounds: Normal breath sounds. No wheezing.  Abdominal:     General: Bowel sounds are normal. There is no distension.     Tenderness: There is no abdominal tenderness. There is no guarding or rebound.     Comments:    Musculoskeletal:        General: No swelling or tenderness.  Lymphadenopathy:     Cervical: No cervical adenopathy.  Skin:     General: Skin is dry.  Neurological:     Mental Status: She  is alert. Mental status is at baseline.     Sensory: No sensory deficit.     Motor: No weakness.     Comments: Gait normal  EOMI No nystagmus Finger nose neg Strength intact  Psychiatric:        Mood and Affect: Mood normal.     Last CBC Lab Results  Component Value Date   WBC 10.1 04/24/2022   HGB 13.0 04/24/2022   HCT 39.1 04/24/2022   MCV 87.5 04/24/2022   MCH 29.1 04/24/2022   RDW 12.2 04/24/2022   PLT 229 04/24/2022   Last metabolic panel Lab Results  Component Value Date   GLUCOSE 97 04/24/2022   NA 140 04/24/2022   K 4.0 04/24/2022   CL 105 04/24/2022   CO2 24 04/24/2022   BUN 8 04/24/2022   CREATININE 0.79 04/24/2022   GFRNONAA >60 04/24/2022   CALCIUM 9.9 04/24/2022   PROT 7.1 04/24/2022   ALBUMIN 4.6 04/24/2022   BILITOT 0.4 04/24/2022   ALKPHOS 53 04/24/2022   AST 15 04/24/2022   ALT 8 04/24/2022   ANIONGAP 11 04/24/2022   Last lipids Lab Results  Component Value Date   CHOL 150 03/08/2021   HDL 42 03/08/2021   LDLCALC 87 03/08/2021   TRIG 105 03/08/2021   CHOLHDL 3.6 03/08/2021   Last hemoglobin A1c Lab Results  Component Value Date   HGBA1C 5.2 03/08/2021   Last thyroid  functions Lab Results  Component Value Date   TSH 0.822 03/08/2021   Last vitamin D No results found for: 25OHVITD2, 25OHVITD3, VD25OH Last vitamin B12 and Folate No results found for: VITAMINB12, FOLATE     Assessment & Plan:   Assessment & Plan Syncope, unspecified syncope type Pt c/o dizziness  Tilt table test positive Pt wants to have a second opinion from a different cardiologist  Referral placed Orders:   Ambulatory referral to Cardiology  Other migraine without status migrainosus, not intractable Pt c/o flare up more frequently Hasn't started topomax Neuro exam unremarkable Instructed to follow up with neurology    Recent change in frequency of bowel movements Will check  TSH    Other fatigue Pt c/o fatigue Phq9-2  Will check labs Orders:   CBC with Differential/Platelet; Future   COMPLETE METABOLIC PANEL WITHOUT GFR; Future   TSH; Future   Sedimentation rate; Future  Dizziness Pt c/o shakiness when she skips meals and her symptoms improves after eating Will check insulin  and c peptide Orders:   Insulin  and C-Peptide; Future  Screening for lipid disorders Bmi 25 Orders:   Lipid panel; Future  Screening for diabetes mellitus  Orders:   Hemoglobin A1c; Future  Muscle weakness Pt c/o muscle cramps, fatigue Will check ESR Orders:   Sedimentation rate; Future  Screening for cervical cancer  Orders:   Ambulatory referral to Gynecology   Return in about 6 weeks (around 11/16/2024).   Jackalyn Blazing, MD [facility]

## 2024-10-06 ENCOUNTER — Encounter: Payer: Self-pay | Admitting: Sports Medicine

## 2024-10-07 ENCOUNTER — Other Ambulatory Visit (INDEPENDENT_AMBULATORY_CARE_PROVIDER_SITE_OTHER)

## 2024-10-07 ENCOUNTER — Other Ambulatory Visit

## 2024-10-07 DIAGNOSIS — R5383 Other fatigue: Secondary | ICD-10-CM | POA: Diagnosis not present

## 2024-10-07 DIAGNOSIS — Z131 Encounter for screening for diabetes mellitus: Secondary | ICD-10-CM | POA: Diagnosis not present

## 2024-10-07 DIAGNOSIS — M6281 Muscle weakness (generalized): Secondary | ICD-10-CM | POA: Diagnosis not present

## 2024-10-07 DIAGNOSIS — Z13220 Encounter for screening for lipoid disorders: Secondary | ICD-10-CM

## 2024-10-07 DIAGNOSIS — R42 Dizziness and giddiness: Secondary | ICD-10-CM

## 2024-10-08 LAB — COMPLETE METABOLIC PANEL WITHOUT GFR
AG Ratio: 2.1 (calc) (ref 1.0–2.5)
ALT: 11 U/L (ref 6–29)
AST: 16 U/L (ref 10–30)
Albumin: 4.7 g/dL (ref 3.6–5.1)
Alkaline phosphatase (APISO): 60 U/L (ref 31–125)
BUN: 11 mg/dL (ref 7–25)
CO2: 30 mmol/L (ref 20–32)
Calcium: 9.2 mg/dL (ref 8.6–10.2)
Chloride: 104 mmol/L (ref 98–110)
Creat: 0.76 mg/dL (ref 0.50–0.96)
Globulin: 2.2 g/dL (ref 1.9–3.7)
Glucose, Bld: 79 mg/dL (ref 65–99)
Potassium: 4.1 mmol/L (ref 3.5–5.3)
Sodium: 139 mmol/L (ref 135–146)
Total Bilirubin: 0.4 mg/dL (ref 0.2–1.2)
Total Protein: 6.9 g/dL (ref 6.1–8.1)

## 2024-10-08 LAB — CBC WITH DIFFERENTIAL/PLATELET
Basophils Absolute: 0.1 K/uL (ref 0.0–0.1)
Basophils Relative: 1.2 % (ref 0.0–3.0)
Eosinophils Absolute: 0.1 K/uL (ref 0.0–0.7)
Eosinophils Relative: 1.7 % (ref 0.0–5.0)
HCT: 39.5 % (ref 36.0–46.0)
Hemoglobin: 13.2 g/dL (ref 12.0–15.0)
Lymphocytes Relative: 38.9 % (ref 12.0–46.0)
Lymphs Abs: 2.1 K/uL (ref 0.7–4.0)
MCHC: 33.5 g/dL (ref 30.0–36.0)
MCV: 86.8 fl (ref 78.0–100.0)
Monocytes Absolute: 0.5 K/uL (ref 0.1–1.0)
Monocytes Relative: 8.5 % (ref 3.0–12.0)
Neutro Abs: 2.7 K/uL (ref 1.4–7.7)
Neutrophils Relative %: 49.7 % (ref 43.0–77.0)
Platelets: 209 K/uL (ref 150.0–400.0)
RBC: 4.55 Mil/uL (ref 3.87–5.11)
RDW: 13.2 % (ref 11.5–15.5)
WBC: 5.4 K/uL (ref 4.0–10.5)

## 2024-10-08 LAB — C-PEPTIDE: C-Peptide: 1.24 ng/mL (ref 0.80–3.85)

## 2024-10-08 LAB — SEDIMENTATION RATE: Sed Rate: 5 mm/h (ref 0–20)

## 2024-10-08 LAB — HEMOGLOBIN A1C: Hgb A1c MFr Bld: 5.7 % (ref 4.6–6.5)

## 2024-10-08 LAB — LIPID PANEL
Cholesterol: 140 mg/dL (ref 28–200)
HDL: 48.9 mg/dL (ref >39.00)
LDL Cholesterol: 77 mg/dL (ref 10–99)
NonHDL: 91.21
Total CHOL/HDL Ratio: 3
Triglycerides: 70 mg/dL (ref 10.0–149.0)
VLDL: 14 mg/dL (ref 0.0–40.0)

## 2024-10-08 LAB — TSH: TSH: 1.4 u[IU]/mL (ref 0.35–5.50)

## 2024-10-08 LAB — INSULIN, RANDOM: Insulin: 8.7 u[IU]/mL

## 2024-10-08 LAB — CLIENT EDUCATION TRACKING

## 2024-10-19 NOTE — Progress Notes (Unsigned)
 "  Initial neurology clinic note  April Harding MRN: [id number] DOB: [birth year]  Referring provider: Nena Rosina LITTIE DEVONNA  Primary care provider: Sherlynn Madden, MD  Reason for consult:  headaches  Subjective:  This is April Harding, a 23 y.o. ***-handed female with a medical history of migraines, iron deficiency, asthma, anxiety*** who presents to neurology clinic with headaches***. The patient is accompanied by ***.  *** Had a concussion in 04/2024 HA every day since and migraines 2-3 times per week Photophobia, blurry vision, tinnitus, nausea PCP started patient on sumatriptan 50 mg on 07/20/24 and referred to neurology  She was seen by neurology on 09/07/24 at [facility] on Topamax 50 mg BID; switched to rizatriptan 10 mg PRN - has not taken them per UC note on 09/28/24? Following up in 3 months?  Also previously tried propranolol but has hx of asthma  On sertraline, clonzazepam  Smoker: OCP use: Caffiene use: EtOH use: Restrictive diet: Family history of neurologic disease including headaches:   MEDICATIONS:  Outpatient Encounter Medications as of 10/29/2024  Medication Sig   acetaminophen  (TYLENOL ) 325 MG tablet Take 650 mg by mouth. (Patient not taking: Reported on 10/05/2024)   ciclopirox (PENLAC) 8 % solution at bedtime. (Patient not taking: Reported on 10/05/2024)   clindamycin (CLEOCIN T) 1 % external solution Apply topically every morning. (Patient not taking: Reported on 10/05/2024)   clobetasol (TEMOVATE) 0.05 % external solution Apply topically 2 (two) times daily. (Patient not taking: Reported on 10/05/2024)   ibuprofen  (ADVIL ) 600 MG tablet Take 1 tablet (600 mg total) by mouth every 8 (eight) hours as needed for moderate pain.   KLONOPIN 0.5 MG tablet    ondansetron  (ZOFRAN -ODT) 4 MG disintegrating tablet Take 1 tablet (4 mg total) by mouth every 8 (eight) hours as needed for nausea or vomiting.   rizatriptan (MAXALT) 10 MG tablet 1 tab by  mouth at the onset of a headache.  May repeat in 2 hours if needed.  Do not exceed more than two doses in a 24 hour period. (Patient not taking: Reported on 10/05/2024)   sertraline (ZOLOFT) 50 MG tablet Take 50 mg by mouth at bedtime.   topiramate (TOPAMAX) 50 MG tablet 1 tab by mouth at bedtime for 1 week, then 1 tab twice a day   No facility-administered encounter medications on file as of 10/29/2024.    PAST MEDICAL HISTORY: Past Medical History:  Diagnosis Date   Anxiety    Concussion    multiple   Depression    GAD (generalized anxiety disorder)    Head injury    w/LOC   MDD (major depressive disorder)    Migraine    PTSD (post-traumatic stress disorder)    d/t divorce in childhood    PAST SURGICAL HISTORY: Past Surgical History:  Procedure Laterality Date   TONSILLECTOMY AND ADENOIDECTOMY     TYMPANOSTOMY TUBE PLACEMENT     URETHRAL DILATION      ALLERGIES: Allergies[1]  FAMILY HISTORY: Family History  Problem Relation Age of Onset   Atrial fibrillation Father    Hypertension Father    Sudden death Neg Hx    Hyperlipidemia Neg Hx    Heart attack Neg Hx    Diabetes Neg Hx     SOCIAL HISTORY: Social History[2] Social History   Social History Narrative   living with mom    Objective:  Vital Signs:  There were no vitals taken for this visit.  ***  Labs and Imaging review:  Internal labs: 10/07/24: CBC w/ diff unremarkable CMP unremarkable Lipid panel: tChol 140, LDL 77, TG 70.0 HbA1c: 5.7 TSH wnl ESR wnl  External labs: 09/07/24: ANA negative ACE wnl Lyme negative B12: 614 Folate wnl TSH wnl  Imaging/Procedures: CT head, maxillofacial, and cervical spine wo contrast (10/04/22); IMPRESSION: Negative CT head.   Negative for facial fracture.   Negative for cervical spine fracture.   CT head wo contrast (04/09/24): FINDINGS: Brain: No evidence of acute infarction, hemorrhage, hydrocephalus, extra-axial collection or mass  lesion/mass effect.   Vascular: No hyperdense vessel.   Skull: No acute fracture.   Sinuses/Orbits: Clear sinuses.  No acute orbital findings.   Other: No mastoid effusions.   IMPRESSION: No evidence of acute intracranial abnormality.  Tilt table (08/25/24 external): Interpretation:   Test is consistent with vasovagal syncope, with a  cardioinhibitory component (sinus pause).   Assessment/Plan:  April Harding is a 23 y.o. female who presents for evaluation of ***. *** has a relevant medical history of ***. *** neurological examination is pertinent for ***. Available diagnostic data is significant for ***. This constellation of symptoms and objective data would most likely localize to ***. ***  PLAN: -Blood work: *** ***  -Return to clinic ***  The impression above as well as the plan as outlined below were extensively discussed with the patient (in the company of ***) who voiced understanding. All questions were answered to their satisfaction.  The patient was counseled on pertinent fall precautions per the printed material provided today, and as noted under the Patient Instructions section below.***  When available, results of the above investigations and possible further recommendations will be communicated to the patient via telephone/MyChart. Patient to call office if not contacted after expected testing turnaround time.   Total time spent reviewing records, interview, history/exam, documentation, and coordination of care on day of encounter:  *** min   Thank you for allowing me to participate in patient's care.  If I can answer any additional questions, I would be pleased to do so.  Venetia Potters, MD   CC: Sherlynn Madden, MD [address] [place] [address]  CC: Referring provider: Nena Rosina CROME, PA-C [id number] [place],  [place] [address]    [1]  Allergies Allergen Reactions   Bee Venom Shortness Of Breath and Swelling    Prochlorperazine Other (See Comments), Hives, Hypertension, Anxiety, Palpitations and Shortness Of Breath    swelling, hives    Anxiety, chest pain, cough, nausea, palpitations, shortness of breath, hypertension   Ketorolac  Other (See Comments)    Skin crawling sensation BP drops   Procaine Swelling   Reglan  [Metoclopramide ] Anxiety   Caine-1 [Lidocaine] Swelling    Novacaine   Ciprofloxacin Hcl   [2]  Social History Tobacco Use   Smoking status: Never   Smokeless tobacco: Never  Substance Use Topics   Alcohol use: Never   Drug use: Never   "

## 2024-10-29 ENCOUNTER — Ambulatory Visit: Admitting: Neurology

## 2024-11-02 ENCOUNTER — Ambulatory Visit: Attending: Cardiology | Admitting: Cardiology

## 2024-11-05 ENCOUNTER — Encounter: Payer: Self-pay | Admitting: Cardiology

## 2024-11-12 ENCOUNTER — Ambulatory Visit: Attending: Cardiology | Admitting: Cardiology

## 2024-11-12 NOTE — Progress Notes (Unsigned)
" °  Cardiology Office Note:  .   Date:  11/12/2024  ID:  April Harding, DOB [birth year], MRN [id number] PCP: Sherlynn Madden, MD  [facility] Providers Cardiologist:  Newman Lawrence, MD PCP: Sherlynn Madden, MD  No chief complaint on file.    April Harding is a 24 y.o. female with *** Discussed the use of AI scribe software for clinical note transcription with the patient, who gave verbal consent to proceed.  History of Present Illness       There were no vitals filed for this visit.    ROS      Studies Reviewed: .        *** Labs 09/2024: Chol 140, TG 70, HDL 48, LDL 77 HbA1C 5.7% Hb 13.2 Cr 0.76 TSH 1.4  Risk Assessment/Calculations:   {Does this patient have ATRIAL FIBRILLATION?:[phone]}    Physical Exam   VISIT DIAGNOSES: No diagnosis found.   April Harding is a 24 y.o. female with *** Assessment and Plan Assessment & Plan       {Are you ordering a CV Procedure (e.g. stress test, cath, DCCV, TEE, etc)?   Press F2        :[phone]}    No orders of the defined types were placed in this encounter.    F/u in ***  Signed, Newman JINNY Lawrence, MD  "

## 2024-11-16 ENCOUNTER — Ambulatory Visit: Admitting: Sports Medicine

## 2024-11-19 ENCOUNTER — Ambulatory Visit: Attending: Cardiology | Admitting: Cardiology

## 2024-12-14 ENCOUNTER — Ambulatory Visit: Admitting: Diagnostic Neuroimaging

## 2025-01-11 ENCOUNTER — Encounter: Admitting: Obstetrics and Gynecology
